# Patient Record
Sex: Female | Born: 1960 | Race: White | Hispanic: No | Marital: Single | Smoking: Former smoker
Health system: Southern US, Community
[De-identification: ages and names within clinical notes are randomized; demographics above are authoritative.]

## PROBLEM LIST (undated history)

## (undated) DIAGNOSIS — IMO0001 Reserved for inherently not codable concepts without codable children: Secondary | ICD-10-CM

## (undated) DIAGNOSIS — J302 Other seasonal allergic rhinitis: Secondary | ICD-10-CM

## (undated) DIAGNOSIS — K219 Gastro-esophageal reflux disease without esophagitis: Secondary | ICD-10-CM

## (undated) DIAGNOSIS — M51369 Other intervertebral disc degeneration, lumbar region without mention of lumbar back pain or lower extremity pain: Secondary | ICD-10-CM

## (undated) DIAGNOSIS — T7840XA Allergy, unspecified, initial encounter: Secondary | ICD-10-CM

## (undated) DIAGNOSIS — D219 Benign neoplasm of connective and other soft tissue, unspecified: Secondary | ICD-10-CM

## (undated) DIAGNOSIS — M519 Unspecified thoracic, thoracolumbar and lumbosacral intervertebral disc disorder: Secondary | ICD-10-CM

## (undated) DIAGNOSIS — H409 Unspecified glaucoma: Secondary | ICD-10-CM

## (undated) DIAGNOSIS — Z8742 Personal history of other diseases of the female genital tract: Secondary | ICD-10-CM

## (undated) DIAGNOSIS — M5136 Other intervertebral disc degeneration, lumbar region: Secondary | ICD-10-CM

## (undated) HISTORY — DX: Allergy, unspecified, initial encounter: T78.40XA

## (undated) HISTORY — DX: Other intervertebral disc degeneration, lumbar region: M51.36

## (undated) HISTORY — DX: Reserved for inherently not codable concepts without codable children: IMO0001

## (undated) HISTORY — DX: Gastro-esophageal reflux disease without esophagitis: K21.9

## (undated) HISTORY — DX: Benign neoplasm of connective and other soft tissue, unspecified: D21.9

## (undated) HISTORY — DX: Unspecified thoracic, thoracolumbar and lumbosacral intervertebral disc disorder: M51.9

## (undated) HISTORY — PX: BACK SURGERY: SHX140

## (undated) HISTORY — DX: Other seasonal allergic rhinitis: J30.2

## (undated) HISTORY — DX: Personal history of other diseases of the female genital tract: Z87.42

## (undated) HISTORY — DX: Unspecified glaucoma: H40.9

## (undated) HISTORY — DX: Other intervertebral disc degeneration, lumbar region without mention of lumbar back pain or lower extremity pain: M51.369

---

## 2004-08-20 ENCOUNTER — Ambulatory Visit: Payer: Self-pay

## 2005-06-20 HISTORY — PX: VESICOVAGINAL FISTULA CLOSURE W/ TAH: SUR271

## 2005-11-02 ENCOUNTER — Ambulatory Visit: Payer: Self-pay

## 2006-02-13 ENCOUNTER — Ambulatory Visit: Payer: Self-pay | Admitting: Obstetrics and Gynecology

## 2006-06-20 HISTORY — PX: ABDOMINAL HYSTERECTOMY: SHX81

## 2006-06-20 HISTORY — PX: LAPAROSCOPIC SUPRACERVICAL HYSTERECTOMY: SHX5399

## 2007-03-29 ENCOUNTER — Ambulatory Visit: Payer: Self-pay

## 2007-04-02 ENCOUNTER — Encounter: Payer: Self-pay | Admitting: Gastroenterology

## 2007-04-30 ENCOUNTER — Encounter: Payer: Self-pay | Admitting: Gastroenterology

## 2007-04-30 ENCOUNTER — Ambulatory Visit: Payer: Self-pay | Admitting: Gastroenterology

## 2008-04-17 ENCOUNTER — Ambulatory Visit: Payer: Self-pay

## 2009-04-20 ENCOUNTER — Ambulatory Visit: Payer: Self-pay

## 2010-05-10 ENCOUNTER — Ambulatory Visit: Payer: Self-pay | Admitting: Unknown Physician Specialty

## 2010-05-20 ENCOUNTER — Encounter (INDEPENDENT_AMBULATORY_CARE_PROVIDER_SITE_OTHER): Payer: Self-pay | Admitting: *Deleted

## 2010-06-08 ENCOUNTER — Ambulatory Visit: Payer: Self-pay | Admitting: Gastroenterology

## 2010-07-02 ENCOUNTER — Encounter (INDEPENDENT_AMBULATORY_CARE_PROVIDER_SITE_OTHER): Payer: Self-pay | Admitting: *Deleted

## 2010-07-12 ENCOUNTER — Encounter: Payer: Self-pay | Admitting: Gastroenterology

## 2010-07-12 ENCOUNTER — Ambulatory Visit
Admission: RE | Admit: 2010-07-12 | Discharge: 2010-07-12 | Payer: Self-pay | Source: Home / Self Care | Attending: Gastroenterology | Admitting: Gastroenterology

## 2010-07-20 NOTE — Letter (Signed)
Summary: Pre Visit Letter Revised  [facility]  [address] [place], [place] [address]   Phone: [phone]  Fax: [phone]        05/20/2010 MRN: [id number] [facility] [address] [place], [place]  [address]             Procedure Date:  06/25/2010   Welcome to the Gastroenterology Division at [facility].    You are scheduled to see a nurse for your pre-procedure visit on 06/08/2010 at 4:30 on the 3rd floor at [facility], [address]  We ask that you try to arrive at our office 15 minutes prior to your appointment time to allow for check-in.  Please take a minute to review the attached form.  If you answer "Yes" to one or more of the questions on the first page, we ask that you call the person listed at your earliest opportunity.  If you answer "No" to all of the questions, please complete the rest of the form and bring it to your appointment.    Your nurse visit will consist of discussing your medical and surgical history, your immediate family medical history, and your medications.   If you are unable to list all of your medications on the form, please bring the medication bottles to your appointment and we will list them.  We will need to be aware of both prescribed and over the counter drugs.  We will need to know exact dosage information as well.    Please be prepared to read and sign documents such as consent forms, a financial agreement, and acknowledgement forms.  If necessary, and with your consent, a friend or relative is welcome to sit-in on the nurse visit with you.  Please bring your insurance card so that we may make a copy of it.  If your insurance requires a referral to see a specialist, please bring your referral form from your primary care physician.  No co-pay is required for this nurse visit.     If you cannot keep your appointment, please call [phone] to cancel or reschedule prior to your appointment date.  This  allows [place] the opportunity to schedule an appointment for another patient in need of care.    Thank you for choosing [facility] for your medical needs.  We appreciate the opportunity to care for you.  Please visit [place] at our website  to learn more about our practice.  Sincerely, The Gastroenterology Division

## 2010-07-22 NOTE — Letter (Signed)
Summary: [facility] Instructions  [facility]  [address] [place], [place] [address]   Phone: [phone]  Fax: [phone]       Lindsay Lane    04-16-1961    MRN: [id number]        Procedure Day /Date: Monday 07-12-10     Arrival Time: 1:00 pm     Procedure Time: 2:00 pm     Location of Procedure:                    _x _  [facility] (4th Floor)   PREPARATION FOR COLONOSCOPY WITH MOVIPREP   Starting 5 days prior to your procedure  07-07-10  do not eat nuts, seeds, popcorn, corn, beans, peas,  salads, or any raw vegetables.  Do not take any fiber supplements (e.g. Metamucil, Citrucel, and Benefiber).  THE DAY BEFORE YOUR PROCEDURE         DATE:  07-11-10  DAY:  Sunday   1.  Drink clear liquids the entire day-NO SOLID FOOD  2.  Do not drink anything colored red or purple.  Avoid juices with pulp.  No orange juice.  3.  Drink at least 64 oz. (8 glasses) of fluid/clear liquids during the day to prevent dehydration and help the prep work efficiently.  CLEAR LIQUIDS INCLUDE: Water Jello Ice Popsicles Tea (sugar ok, no milk/cream) Powdered fruit flavored drinks Coffee (sugar ok, no milk/cream) Gatorade Juice: apple, white grape, white cranberry  Lemonade Clear bullion, consomm, broth Carbonated beverages (any kind) Strained chicken noodle soup Hard Candy                             4.  In the morning, mix first dose of MoviPrep solution:    Empty 1 Pouch A and 1 Pouch B into the disposable container    Add lukewarm drinking water to the top line of the container. Mix to dissolve    Refrigerate (mixed solution should be used within 24 hrs)  5.  Begin drinking the prep at 5:00 p.m. The MoviPrep container is divided by 4 marks.   Every 15 minutes drink the solution down to the next mark (approximately 8 oz) until the full liter is complete.   6.  Follow completed prep with 16 oz of clear liquid of your choice (Nothing red or purple).   Continue to drink clear liquids until bedtime.  7.  Before going to bed, mix second dose of MoviPrep solution:    Empty 1 Pouch A and 1 Pouch B into the disposable container    Add lukewarm drinking water to the top line of the container. Mix to dissolve    Refrigerate  THE DAY OF YOUR PROCEDURE      DATE:  07-12-10   DAY:  Monday  Beginning at  9:00 a.m. (5 hours before procedure):         1. Every 15 minutes, drink the solution down to the next mark (approx 8 oz) until the full liter is complete.  2. Follow completed prep with 16 oz. of clear liquid of your choice.    3. You may drink clear liquids until  12:00 p.m. (2 HOURS BEFORE PROCEDURE).   MEDICATION INSTRUCTIONS  Unless otherwise instructed, you should take regular prescription medications with a small sip of water   as early as possible the morning of your procedure.  OTHER INSTRUCTIONS  You will need a responsible adult at least 51 years of age to accompany you and drive you home.   This person must remain in the waiting room during your procedure.  Wear loose fitting clothing that is easily removed.  Leave jewelry and other valuables at home.  However, you may wish to bring a book to read or  an iPod/MP3 player to listen to music as you wait for your procedure to start.  Remove all body piercing jewelry and leave at home.  Total time from sign-in until discharge is approximately 2-3 hours.  You should go home directly after your procedure and rest.  You can resume normal activities the  day after your procedure.  The day of your procedure you should not:   Drive   Make legal decisions   Operate machinery   Drink alcohol   Return to work  You will receive specific instructions about eating, activities and medications before you leave.    The above instructions have been reviewed and explained to me by   Karl Bales RN  July 05, 2010 2:39 PM    I fully understand and can  verbalize these instructions _____________________________ Date _________

## 2010-07-22 NOTE — Miscellaneous (Signed)
Summary: [facility] previsit  Clinical Lists Changes  Medications: Added new medication of MOVIPREP 100 GM  SOLR (PEG-KCL-NACL-NASULF-NA ASC-C) As per prep instructions. - Signed Rx of MOVIPREP 100 GM  SOLR (PEG-KCL-NACL-NASULF-NA ASC-C) As per prep instructions.;  #1 x 0;  Signed;  Entered by: Karl Bales RN;  Authorized by: Louis Meckel MD;  Method used: Electronically to [id number] [address] [place], [place], [place]  [address], Ph: [phone], Fax: [phone] Observations: Added new observation of NKA: T (07/05/2010 14:19)    Prescriptions: MOVIPREP 100 GM  SOLR (PEG-KCL-NACL-NASULF-NA ASC-C) As per prep instructions.  #1 x 0   Entered by:   Karl Bales RN   Authorized by:   Louis Meckel MD   Signed by:   Karl Bales RN on 07/05/2010   Method used:   Electronically to        [facility] [phone]* (retail)       [address]       [place], [place]  [address]       Ph: [phone]       Fax: [phone]   RxID:   [id number]

## 2010-07-22 NOTE — Procedures (Signed)
Summary: Colonoscopy  Patient: [name] Note: All result statuses are Final unless otherwise noted.  Tests: (1) Colonoscopy (COL)   COL Colonoscopy           DONE     [facility]     [address]     [place], [place]  [address]           COLONOSCOPY PROCEDURE REPORT           PATIENT:  Lindsay Lane, Lindsay Lane  MR#:  #[id number]     BIRTHDATE:  Jul 25, 1960, 49 yrs. old  GENDER:  female           ENDOSCOPIST:  Barbette Hair. Arlyce Dice, MD     Referred by:  Adriana Reams, M.D.           PROCEDURE DATE:  07/12/2010     PROCEDURE:  Diagnostic Colonoscopy     ASA CLASS:  Class I     INDICATIONS:  1) screening  2) history of pre-cancerous     (adenomatous) colon polyps Last colo >5 years ago           MEDICATIONS:   Fentanyl 75 mcg IV, Versed 9 mg IV           DESCRIPTION OF PROCEDURE:   After the risks benefits and     alternatives of the procedure were thoroughly explained, informed     consent was obtained.  Digital rectal exam was performed and     revealed no abnormalities.   The LB CF-H180AL [id number] endoscope     was introduced through the anus and advanced to the cecum, which     was identified by both the appendix and ileocecal valve, without     limitations.  The quality of the prep was excellent, using     MoviPrep.  The instrument was then slowly withdrawn as the colon     was fully examined.     <<PROCEDUREIMAGES>>           FINDINGS:  Mild diverticulosis was found in the sigmoid colon (see     image10).  Internal hemorrhoids were found (see image13).  This     was otherwise a normal examination of the colon (see image2,     image3, image5, image6, image7, and image12).   Retroflexed views     in the rectum revealed no abnormalities.    The time to cecum =     2.75  minutes. The scope was then withdrawn (time =  6.0  min) from     the patient and the procedure completed.           COMPLICATIONS:  None           ENDOSCOPIC IMPRESSION:     1) Mild diverticulosis in the  sigmoid colon     2) Internal hemorrhoids     3) Otherwise normal examination     RECOMMENDATIONS:     1) Colonoscopy 7 years           REPEAT EXAM:   7 year(s) Colonoscopy           ______________________________     Barbette Hair. Arlyce Dice, MD           CC:           n.     eSIGNED:   Barbette Hair. Abyan Cadman at 07/12/2010 02:51 PM           Georganna Skeans, #[id number]  Note: An exclamation  mark (!) indicates a result that was not dispersed into the flowsheet. Document Creation Date: 07/12/2010 3:03 PM _______________________________________________________________________  (1) Order result status: Final Collection or observation date-time: 07/12/2010 14:45 Requested date-time:  Receipt date-time:  Reported date-time:  Referring Physician:   Ordering Physician: Melvia Heaps [phone]) Specimen Source:  Source: Launa Grill Order Number: [id number] Lab site:   Appended Document: Colonoscopy    Clinical Lists Changes  Observations: Added new observation of COLONNXTDUE: 06/2017 (07/12/2010 16:43)

## 2010-08-17 NOTE — Letter (Signed)
Summary: Jule Economy MD  Jule Economy MD   Imported By: Lester [place] 08/13/2010 07:37:53  _____________________________________________________________________  External Attachment:    Type:   Image     Comment:   External Document

## 2010-08-17 NOTE — Letter (Signed)
Summary: Sharyne Peach MD  Sharyne Peach MD   Imported By: Lester [place] 08/13/2010 07:46:59  _____________________________________________________________________  External Attachment:    Type:   Image     Comment:   External Document

## 2010-08-17 NOTE — Procedures (Signed)
Summary: Everardo All MD  Everardo All MD   Imported By: Lester [place] 08/13/2010 07:43:02  _____________________________________________________________________  External Attachment:    Type:   Image     Comment:   External Document

## 2011-07-12 ENCOUNTER — Ambulatory Visit: Payer: Self-pay | Admitting: Unknown Physician Specialty

## 2011-08-03 ENCOUNTER — Ambulatory Visit: Payer: Self-pay | Admitting: Family Medicine

## 2011-08-12 ENCOUNTER — Ambulatory Visit: Payer: Self-pay

## 2011-11-15 ENCOUNTER — Ambulatory Visit: Payer: Self-pay | Admitting: Otolaryngology

## 2011-11-19 HISTORY — PX: NASAL SINUS SURGERY: SHX719

## 2012-06-08 ENCOUNTER — Other Ambulatory Visit (INDEPENDENT_AMBULATORY_CARE_PROVIDER_SITE_OTHER): Payer: BC Managed Care – PPO

## 2012-06-08 ENCOUNTER — Encounter: Payer: Self-pay | Admitting: Internal Medicine

## 2012-06-08 ENCOUNTER — Ambulatory Visit (INDEPENDENT_AMBULATORY_CARE_PROVIDER_SITE_OTHER): Payer: BC Managed Care – PPO | Admitting: Internal Medicine

## 2012-06-08 VITALS — BP 130/88 | HR 90 | Temp 97.4°F | Ht 63.0 in | Wt 165.8 lb

## 2012-06-08 DIAGNOSIS — R05 Cough: Secondary | ICD-10-CM

## 2012-06-08 DIAGNOSIS — J329 Chronic sinusitis, unspecified: Secondary | ICD-10-CM

## 2012-06-08 DIAGNOSIS — R059 Cough, unspecified: Secondary | ICD-10-CM

## 2012-06-08 LAB — CBC WITH DIFFERENTIAL/PLATELET
Basophils Absolute: 0.1 10*3/uL (ref 0.0–0.1)
Basophils Relative: 0.6 % (ref 0.0–3.0)
Eosinophils Relative: 15.8 % — ABNORMAL HIGH (ref 0.0–5.0)
HCT: 44 % (ref 36.0–46.0)
Hemoglobin: 15.3 g/dL — ABNORMAL HIGH (ref 12.0–15.0)
Lymphs Abs: 2.1 10*3/uL (ref 0.7–4.0)
Monocytes Relative: 4.7 % (ref 3.0–12.0)
Neutro Abs: 6 10*3/uL (ref 1.4–7.7)
RBC: 4.79 Mil/uL (ref 3.87–5.11)
RDW: 12.7 % (ref 11.5–14.6)

## 2012-06-08 MED ORDER — MOMETASONE FURO-FORMOTEROL FUM 100-5 MCG/ACT IN AERO: INHALATION_SPRAY | RESPIRATORY_TRACT | Status: DC

## 2012-06-08 MED ORDER — PREDNISONE (PAK) 10 MG PO TABS: ORAL_TABLET | ORAL | Status: DC

## 2012-06-08 MED ORDER — AMOXICILLIN-POT CLAVULANATE 875-125 MG PO TABS: 1.0000 | ORAL_TABLET | Freq: Two times a day (BID) | ORAL | Status: DC

## 2012-06-08 MED ORDER — TRAMADOL HCL 50 MG PO TABS: ORAL_TABLET | ORAL | Status: DC

## 2012-06-08 NOTE — Progress Notes (Signed)
  Subjective:    Patient ID: Lindsay Lane, female    DOB: [birth year]  MRN: [id number]  HPI  16 yowf quit smoking Oc 2012  After coughing x sev months but did not improve consistently and so self referred to pulmonary clinic 06/08/2012    06/08/2012 1st pulmonary eval cc persistent cough x summer of 2012 never 100% better x maybe for a few weeks after sinus surgery late May 2013 [place] then much worse since last round of prednisone dx as pna as UC 06/01/12 and rx with levaquin but no better to date with cough to point of gagging but not vomiting.  Cough is variably productive of greenish mucus. No obvious daytime variabilty or assoc cp or chest tightness, subjective wheeze overt sinus or hb symptoms. No unusual exp hx or h/o childhood pna/ asthma or premature birth to her knowledge.    Albuterol helps some.  Sleeping ok without nocturnal  or early am exacerbation  of respiratory  c/o's or need for noct saba. Also denies any obvious fluctuation of symptoms with weather or environmental changes or other aggravating or alleviating factors except as outlined above   Review of Systems  Constitutional: Negative for fever, chills and unexpected weight change.  HENT: Positive for sore throat. Negative for ear pain, nosebleeds, congestion, rhinorrhea, sneezing, trouble swallowing, dental problem, voice change, postnasal drip and sinus pressure.   Eyes: Negative for visual disturbance.  Respiratory: Positive for cough and shortness of breath. Negative for choking.   Cardiovascular: Negative for chest pain and leg swelling.  Gastrointestinal: Negative for vomiting, abdominal pain and diarrhea.  Genitourinary: Negative for difficulty urinating.  Musculoskeletal: Positive for arthralgias.  Skin: Negative for rash.  Neurological: Negative for tremors, syncope and headaches.  Hematological: Does not bruise/bleed easily.       Objective:   Physical Exam  amb wf nad Wt Readings from Last 3  Encounters:  06/08/12 165 lb 12.8 oz (75.206 kg)    HEENT: nl dentition,  and orophanx. Nl external ear canals without cough reflex. L turbinates with copious mucopurulent discharge and generalized swelling. No polyps seen    NECK :  without JVD/Nodes/TM/ nl carotid upstrokes bilaterally   LUNGS: no acc muscle use, clear to A and P bilaterally without cough on insp or exp maneuvers   CV:  RRR  no s3 or murmur or increase in P2, no edema   ABD:  soft and nontender with nl excursion in the supine position. No bruits or organomegaly, bowel sounds nl  MS:  warm without deformities, calf tenderness, cyanosis or clubbing  SKIN: warm and dry without lesions    NEURO:  alert, approp, no deficits        Assessment & Plan:

## 2012-06-08 NOTE — Patient Instructions (Addendum)
dulera 100 Take 2 puffs first thing in am and then another 2 puffs about 12 hours later.   Take mucinex dm 2 every  12 hours and supplement if needed with  tramadol 50 mg up to 2 every 4 hours to suppress the urge to cough. Swallowing water or using ice chips/non mint and menthol containing candies (such as lifesavers or sugarless jolly ranchers) are also effective.  You should rest your voice and avoid activities that you know make you cough.  Once you have eliminated the cough for 3 straight days try reducing the tramadol first,  then the delsym as tolerated.    Augmentin 875 twice daily x 21 days  Prednisone 10 mg take  4 each am x 2 days,   2 each am x 2 days,  1 each am x2days and stop   Prilosec 20 mg Take 30-60 min before first meal of the day and Pepcid ac 20 mg one at bedtime  GERD (REFLUX)  is an extremely common cause of respiratory symptoms, many times with no significant heartburn at all.    It can be treated with medication, but also with lifestyle changes including avoidance of late meals, excessive alcohol, smoking cessation, and avoid fatty foods, chocolate, peppermint, colas, red wine, and acidic juices such as orange juice.  NO MINT OR MENTHOL PRODUCTS SO NO COUGH DROPS  USE SUGARLESS CANDY INSTEAD (jolley ranchers or Stover's)  NO OIL BASED VITAMINS - use powdered substitutes.  Please remember to go to the lab   department downstairs for your tests - we will call you with the results when they are available.     Please schedule a follow up office visit in 3 weeks, sooner if needed

## 2012-06-09 DIAGNOSIS — J329 Chronic sinusitis, unspecified: Secondary | ICD-10-CM | POA: Insufficient documentation

## 2012-06-09 NOTE — Assessment & Plan Note (Signed)
The most common causes of chronic cough in immunocompetent adults include the following: upper airway cough syndrome (UACS), previously referred to as postnasal drip syndrome (PNDS), which is caused by variety of rhinosinus conditions; (2) asthma; (3) GERD; (4) chronic bronchitis from cigarette smoking or other inhaled environmental irritants; (5) nonasthmatic eosinophilic bronchitis; and (6) bronchiectasis.   These conditions, singly or in combination, have accounted for up to 94% of the causes of chronic cough in prospective studies.   Other conditions have constituted no >6% of the causes in prospective studies These have included bronchogenic carcinoma, chronic interstitial pneumonia, sarcoidosis, left ventricular failure, ACEI-induced cough, and aspiration from a condition associated with pharyngeal dysfunction.    Chronic cough is often simultaneously caused by more than one condition. A single cause has been found from 38 to 82% of the time, multiple causes from 18 to 62%. Multiply caused cough has been the result of three diseases up to 42% of the time.       Most likely this is  Classic Upper airway cough syndrome, so named because it's frequently impossible to sort out how much is  CR/sinusitis with freq throat clearing (which can be related to primary GERD)   vs  causing  secondary (" extra esophageal")  GERD from wide swings in gastric pressure that occur with throat clearing, often  promoting self use of mint and menthol lozenges that reduce the lower esophageal sphincter tone and exacerbate the problem further in a cyclical fashion.   These are the same pts (now being labeled as having "irritable larynx syndrome" by some cough centers) who not infrequently have a history of having failed to tolerate ace inhibitors,  dry powder inhalers or biphosphonates or report having atypical reflux symptoms that don't respond to standard doses of PPI , and are easily confused as having aecopd or asthma  flares by even experienced allergists/ pulmonologists, and given her poorly controlled rhinitis she may have secondary asthma at this point  Therefore rec max rx directed at sinusitis and trial of dulera 100 2bid for the ? Asthmatic component.   See instructions for specific recommendations which were reviewed directly with the patient who was given a copy with highlighter outlining the key components.

## 2012-06-09 NOTE — Assessment & Plan Note (Signed)
-   Allergy profile 06/08/2012 > Eos 15.8% >> - augmentin x 21 days 06/09/2012 >>>  This much eosinophilia is typical of both allergic and fungal sinusitis and will need f/u either by ent or allergy, depending on IgE profile   In meantime rx empirically with 3 weeks of augmentin and continue topical rx

## 2012-06-11 ENCOUNTER — Encounter: Payer: Self-pay | Admitting: Internal Medicine

## 2012-06-11 LAB — ALLERGY PROFILE REGION II-DC, DE, MD, ~~LOC~~, VA
Alternaria Alternata: <0.1 kU/L
Aspergillus fumigatus, IgG: <0.1 kU/L
Cat Dander: <0.1 kU/L
Cladosporium Herbarum: <0.1 kU/L
D. farinae: <0.1 kU/L
Elm IgE: <0.1 kU/L
IgE (Immunoglobulin E), Serum: 330.4 IU/mL — ABNORMAL HIGH (ref 0.0–180.0)
Johnson Grass: 0.18 kU/L — ABNORMAL HIGH

## 2012-06-12 NOTE — Progress Notes (Signed)
Quick Note:  Spoke with pt and notified of results per Dr. Wert. Pt verbalized understanding and denied any questions.  ______ 

## 2012-07-02 ENCOUNTER — Encounter: Payer: Self-pay | Admitting: Internal Medicine

## 2012-07-02 ENCOUNTER — Ambulatory Visit (INDEPENDENT_AMBULATORY_CARE_PROVIDER_SITE_OTHER): Payer: BC Managed Care – PPO | Admitting: Internal Medicine

## 2012-07-02 VITALS — BP 126/70 | HR 76 | Temp 97.6°F | Ht 63.5 in | Wt 164.0 lb

## 2012-07-02 DIAGNOSIS — R05 Cough: Secondary | ICD-10-CM

## 2012-07-02 DIAGNOSIS — J329 Chronic sinusitis, unspecified: Secondary | ICD-10-CM

## 2012-07-02 MED ORDER — FAMOTIDINE 20 MG PO TABS: ORAL_TABLET | ORAL | Status: DC

## 2012-07-02 NOTE — Patient Instructions (Addendum)
If cough or short of breath > dulera 100 2puffs every 12 hours  For congestion/ thick mucus > mucinex dm and supplement with tramadol if really bad   I emphasized that nasal steroids have no immediate benefit in terms of improving symptoms.  To help them reached the target tissue, the patient should use Afrin two puffs every 12 hours applied one min before using the nasal steroids.  Afrin should be stopped after no more than 5 days.  If the symptoms worsen, Afrin can be restarted after 5 days off of therapy to prevent rebound congestion from overuse of Afrin.  I also emphasized that in no way are nasal steroids a concern in terms of "addiction".   Please schedule a follow up office visit in 6 weeks, call sooner if needed  Late add:  Consider singulair/ allergy eval next ov

## 2012-07-02 NOTE — Progress Notes (Signed)
Subjective:    Patient ID: Lindsay Lane, female    DOB: 02/04/1961  MRN: [id number]  HPI  11 yowf quit smoking Oct 2012  After coughing x sev months but did not improve consistently and so self referred to pulmonary clinic 06/08/2012    06/08/2012 1st pulmonary eval cc persistent cough x summer of 2012 never 100% better x maybe for a few weeks after sinus surgery late May 2013 [place] then much worse since last round of prednisone dx as pna as UC 06/01/12 and rx with levaquin but no better to date with cough to point of gagging but not vomiting.  Cough is variably productive of greenish mucus. rec dulera 100 Take 2 puffs first thing in am and then another 2 puffs about 12 hours later.  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    07/02/2012 f/u ov/Lindsay Lane cc 100% better while on augmentin, and acid suppression, and able stop the cough meds but then sev days prior to OV  Felt nasal congestion coming back -  in retrospect all these problems started w/in one year of new dog in house. Stopped dulera as no sob     No obvious daytime variabilty or assoc cp or chest tightness, subjective wheeze overt sinus or hb symptoms. No unusual exp hx or h/o childhood pna/ asthma or premature birth to her knowledge.     Sleeping ok without nocturnal  or early am exacerbation  of respiratory  c/o's or need for noct saba. Also denies any obvious fluctuation of symptoms with weather or environmental changes or other aggravating or alleviating factors except as outlined above.    ROS  The following are not active complaints unless bolded sore throat, dysphagia,  dental problems, itching, sneezing,  nasal congestion or excess/ purulent secretions, ear ache,   fever, chills, sweats, unintended wt loss, pleuritic or exertional cp, hemoptysis,  orthopnea pnd or leg swelling, presyncope, palpitations, heartburn, abdominal pain, anorexia, nausea, vomiting, diarrhea  or change in bowel or urinary habits, change in stools or urine, dysuria,hematuria,  rash, arthralgias, visual complaints, headache, numbness weakness or ataxia or problems with walking or coordination,  change in mood/affect or memory.            Objective:   Physical Exam  amb wf nad Wt 164 07/02/2012  Wt Readings from Last 3 Encounters:  06/08/12 165 lb 12.8 oz (75.206 kg)    HEENT: nl dentition,  and orophanx. Nl external ear canals without cough reflex. L turbinates with copious mucopurulent discharge and generalized swelling. No polyps seen    NECK :  without JVD/Nodes/TM/ nl carotid upstrokes bilaterally   LUNGS: no acc muscle use, clear to A and P bilaterally without cough on insp or exp maneuvers   CV:  RRR  no s3 or murmur or increase in P2, no edema   ABD:  soft and nontender with nl excursion in the supine position. No bruits or organomegaly, bowel sounds nl  MS:  warm without deformities, calf tenderness, cyanosis or  clubbing  SKIN: warm and dry without lesions    NEURO:  alert, approp, no deficits        Assessment & Plan:

## 2012-07-02 NOTE — Assessment & Plan Note (Signed)
-   Allergy profile 06/08/2012 > Eos 15.8% > IgE 330.4 > Pos dogs, grass, ragweed - augmentin x 21 days 06/09/2012 > improved at ov 07/02/2012   I reviewed both graphic and text formatted material regarding the diagnosis and management of rhinitis including how to use topical steroids effectively and why it is necessary to treat nasal obstruction and inflammation and infection all at  the same time to eradicate the cycle of inflammation causing obstruction causing an infection causing inflammation and so forth....  May benefit from formal allergy eval if nasal steroids not effective (also consider singulair )

## 2012-07-02 NOTE — Assessment & Plan Note (Signed)
Most likely this is allergic rhinitis/ sinusitis > pnds transiently better from augmentin and prednisone, could also have component of cough variant asthma or eosinophilic bronchitis based on such convincing response to prednisone.  For now will max rx for sinusitis/ rhinitis and see if recurs and in meantime continue max acid suppression to prevent cyclical coughing.

## 2012-07-30 ENCOUNTER — Ambulatory Visit: Payer: BC Managed Care – PPO | Admitting: Internal Medicine

## 2012-08-01 ENCOUNTER — Ambulatory Visit: Payer: BC Managed Care – PPO | Admitting: Internal Medicine

## 2012-12-17 ENCOUNTER — Encounter: Payer: Self-pay | Admitting: Internal Medicine

## 2012-12-17 ENCOUNTER — Ambulatory Visit (INDEPENDENT_AMBULATORY_CARE_PROVIDER_SITE_OTHER): Payer: BC Managed Care – PPO | Admitting: Internal Medicine

## 2012-12-17 VITALS — BP 132/82 | HR 76 | Temp 97.9°F | Ht 63.0 in | Wt 156.0 lb

## 2012-12-17 DIAGNOSIS — R05 Cough: Secondary | ICD-10-CM

## 2012-12-17 DIAGNOSIS — J329 Chronic sinusitis, unspecified: Secondary | ICD-10-CM

## 2012-12-17 MED ORDER — METHYLPREDNISOLONE ACETATE 80 MG/ML IJ SUSP
120.0000 mg | Freq: Once | INTRAMUSCULAR | Status: AC
  Administered 2012-12-17: 120 mg via INTRAMUSCULAR

## 2012-12-17 MED ORDER — MONTELUKAST SODIUM 10 MG PO TABS: 10.0000 mg | ORAL_TABLET | Freq: Every day | ORAL | Status: DC

## 2012-12-17 MED ORDER — TRAMADOL HCL 50 MG PO TABS: ORAL_TABLET | ORAL | Status: DC

## 2012-12-17 MED ORDER — AMOXICILLIN-POT CLAVULANATE 875-125 MG PO TABS: 1.0000 | ORAL_TABLET | Freq: Two times a day (BID) | ORAL | Status: DC

## 2012-12-17 NOTE — Progress Notes (Signed)
Subjective:    Patient ID: Lindsay Lane, female    DOB: 1960-07-10  MRN: [id number]  HPI  76 yowf quit smoking Oct 2012  After coughing x sev months but did not improve consistently and so self referred to pulmonary clinic 06/08/2012    06/08/2012 1st pulmonary eval cc persistent cough x summer of 2012 never 100% better x maybe for a few weeks after sinus surgery late May 2013 [place] then much worse since last round of prednisone dx as pna as UC 06/01/12 and rx with levaquin but no better to date with cough to point of gagging but not vomiting.  Cough is variably productive of greenish mucus. rec dulera 100 Take 2 puffs first thing in am and then another 2 puffs about 12 hours later.  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    07/02/2012 f/u ov/Lindsay Lane cc 100% better while on augmentin, and acid suppression, and able stop the cough meds but then sev days prior to OV  Felt nasal congestion coming back -  in retrospect all these problems started w/in one year of new dog in house. Stopped dulera as no sob   rec If cough or short of breath > dulera 100 2puffs every 12 hours For congestion/ thick mucus > mucinex dm and supplement with tramadol if really bad  I emphasized that nasal steroids have no immediate benefit in terms of improving symptoms.  To help them reached the target tissue, the patient should use Afrin two puffs every 12 hours applied one min before using the nasal steroids.  Afrin should be stopped after no more than 5 days.  If the symptoms worsen, Afrin can be restarted after 5 days off of therapy to prevent rebound congestion  from overuse of Afrin.  I also emphasized that in no way are nasal steroids a concern in terms of "addiction".  F/u 6 weeks > did not return as instructed     12/17/2012 f/u ov/Lindsay Lane f/u cough  Chief Complaint  Patient presents with  . Acute Visit    increased cough x 4 wks with green to greenish yellow mucus, wheezing, and sweats.     Better for months on nasonex and zyrtec then worse x 65mo but did not use afrin/ nasonex as instructed nor take dulera as per instructions but back on it at ov s improvement.     No obvious daytime variabilty or assoc cp or chest tightness, subjective wheeze overt sinus or hb symptoms. No unusual exp hx or h/o childhood pna/ asthma or premature birth to her knowledge.     Sleeping ok without nocturnal  or early am exacerbation  of respiratory  c/o's or need for noct saba. Also denies any obvious fluctuation of symptoms with weather or environmental changes or other aggravating or alleviating factors except as outlined above.    Current Medications, Allergies, Past Medical History, Past Surgical History, Family History, and Social History were reviewed in [employer] record.  ROS  The following are not  active complaints unless bolded sore throat, dysphagia, dental problems, itching, sneezing,  nasal congestion or excess/ purulent secretions, ear ache,   fever, chills, sweats, unintended wt loss, pleuritic or exertional cp, hemoptysis,  orthopnea pnd or leg swelling, presyncope, palpitations, heartburn, abdominal pain, anorexia, nausea, vomiting, diarrhea  or change in bowel or urinary habits, change in stools or urine, dysuria,hematuria,  rash, arthralgias, visual complaints, headache, numbness weakness or ataxia or problems with walking or coordination,  change in mood/affect or memory.                Objective:   Physical Exam  amb wf nad with nl vital signs  Wt 164 07/02/2012 > 12/17/2012  156  Wt Readings from Last 3  Encounters:  06/08/12 165 lb 12.8 oz (75.206 kg)    HEENT: nl dentition,  and orophanx. Nl external ear canals without cough reflex. L turbinates with copious mucopurulent discharge and generalized swelling. No polyps seen    NECK :  without JVD/Nodes/TM/ nl carotid upstrokes bilaterally   LUNGS: no acc muscle use, clear to A and P bilaterally without cough on insp or exp maneuvers   CV:  RRR  no s3 or murmur or increase in P2, no edema   ABD:  soft and nontender with nl excursion in the supine position. No bruits or organomegaly, bowel sounds nl  MS:  warm without deformities, calf tenderness, cyanosis or clubbing  SKIN: warm and dry without lesions             Assessment & Plan:

## 2012-12-17 NOTE — Patient Instructions (Addendum)
Try singulair 10 mg every evening  dulera 100 Take 2 puffs first thing in am and then another 2 puffs about 12 hours later.   Prednisone 10 mg take  4 each am x 2 days,   2 each am x 2 days,  1 each am x 2 days and stop   augmentin 875 mg twice daily x 10 days - extend to another 10 days if the mucus stays   I emphasized that nasal steroids have no immediate benefit in terms of improving symptoms.  To help them reached the target tissue, the patient should use Afrin two puffs every 12 hours applied one min before using the nasal steroids.  Afrin should be stopped after no more than 5 days.  If the symptoms come back, Afrin can be restarted after 5 days off of therapy to prevent rebound congestion from overuse of Afrin.  I also emphasized that in no way are nasal steroids a concern in terms of "addiction".   For congestion/ thick mucus > mucinex dm and supplement with tramadol if really bad   Also stay on the pepcid at bedtime as long as coughing

## 2012-12-19 NOTE — Assessment & Plan Note (Addendum)
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

## 2012-12-19 NOTE — Assessment & Plan Note (Signed)
Apparently flaring again > rx x up to 20 days then consider CT and either ent or allergy eval

## 2013-01-21 ENCOUNTER — Ambulatory Visit: Payer: BC Managed Care – PPO | Admitting: Internal Medicine

## 2013-01-25 ENCOUNTER — Encounter: Payer: Self-pay | Admitting: Internal Medicine

## 2013-01-25 ENCOUNTER — Ambulatory Visit (INDEPENDENT_AMBULATORY_CARE_PROVIDER_SITE_OTHER): Payer: BC Managed Care – PPO | Admitting: Internal Medicine

## 2013-01-25 VITALS — BP 120/82 | HR 63 | Temp 97.0°F | Ht 63.0 in | Wt 154.4 lb

## 2013-01-25 DIAGNOSIS — J329 Chronic sinusitis, unspecified: Secondary | ICD-10-CM

## 2013-01-25 NOTE — Patient Instructions (Addendum)
No change in maintenance medication   Add dulera back if breathing bothers you  Add mucinex dm as needed for cough  At any any respiratory  flare add afrin x 5 days    If you are satisfied with your treatment plan let your doctor know and he/she can either refill your medications or you can return here when your prescription runs out.     If in any way you are not 100% satisfied,  please tell us.  If 100% better, tell your friends!

## 2013-01-25 NOTE — Assessment & Plan Note (Addendum)
-   Allergy profile 06/08/2012 > Eos 15.8% > IgE 330.4 > Pos dogs, grass, ragweed - augmentin x 21 days 06/09/2012 > improved at ov 07/02/2012  - started singulair trial 12/17/12 > improved 01/25/2013   She clearly has an allergic component to her rhinitis/sinusitis and this triggers pnds /cough and may destabilize the lower airways as well  I had an extended summary  discussion with the patient today lasting 15 to 20 minutes of a 25 minute visit on the following issues:     Each maintenance medication was reviewed in detail including most importantly the difference between maintenance and as needed and under what circumstances the prns are to be used.  Please see instructions for details which were reviewed in writing and the patient given a copy.  If flares during ragweed season she should acutely increase her nasonex with afrin x 5 days but I would also strongly consider allergy eval and consideration for immunotherapy

## 2013-01-25 NOTE — Progress Notes (Signed)
Subjective:    Patient ID: Lindsay Lane, female    DOB: [birth year]  MRN: [id number]   Brief patient profile:  51 yowf quit smoking Oct 2012  After coughing x sev months but did not improve consistently and so self referred to pulmonary clinic 06/08/2012    06/08/2012 1st pulmonary eval cc persistent cough x summer of 2012 never 100% better x maybe for a few weeks after sinus surgery late May 2013 [place] then much worse since last round of prednisone dx as pna as UC 06/01/12 and rx with levaquin but no better to date with cough to point of gagging but not vomiting.  Cough is variably productive of greenish mucus. rec dulera 100 Take 2 puffs first thing in am and then another 2 puffs about 12 hours later.  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    07/02/2012 f/u ov/Lindsay Lane cc 100% better while on augmentin, and acid suppression, and able stop the cough meds but then sev days prior to OV  Felt nasal congestion coming back -  in retrospect all these problems started w/in one year of new dog in house. Stopped dulera as no sob   rec If cough or short of breath > dulera 100 2puffs every 12 hours For congestion/ thick mucus > mucinex dm and supplement with tramadol if really bad  I emphasized that nasal steroids have no immediate benefit  F/u 6 weeks > did not return as instructed     12/17/2012 f/u ov/Lindsay Lane f/u cough  Chief Complaint  Patient presents with  . Acute Visit    increased cough x 4 wks with green to greenish yellow mucus, wheezing, and sweats.   Better for months on nasonex and zyrtec then worse x 20mo but did not use afrin/  nasonex as instructed nor take dulera as per instructions but back on it at ov s improvement.  rec Try singulair 10 mg every evening dulera 100 Take 2 puffs first thing in am and then another 2 puffs about 12 hours later.  Prednisone 10 mg take  4 each am x 2 days,   2 each am x 2 days,  1 each am x 2 days and stop  augmentin 875 mg twice daily x 10 days - extend to another 10 days if the mucus stays  I emphasized that nasal steroids have no immediate benefit  .  For congestion/ thick mucus > mucinex dm and supplement with tramadol if really bad  Also stay on the pepcid at bedtime as long as coughing.   01/25/2013 f/u ov/Lindsay Lane  Still nasonex/singulair and prilosec ac bedtime pepcid not on dulera mucinex dm/tramadol Chief Complaint  Patient presents with  . Follow-up    Cough has resolved since her last visit. She c/o PND for the past several days. Using dulera PRN.    no sob at all not needing albuterol      No obvious daytime variabilty or assoc cp or chest tightness, subjective wheeze overt sinus or hb symptoms. No  unusual exp hx or h/o childhood pna/ asthma or premature birth to her knowledge.     Sleeping ok without nocturnal  or early am exacerbation  of respiratory  c/o's or need for noct saba. Also denies any obvious fluctuation of symptoms with weather or environmental changes or other aggravating or alleviating factors except as outlined above.    Current Medications, Allergies, Past Medical History, Past Surgical History, Family History, and Social History were reviewed in [employer] record.  ROS  The following are not active complaints unless bolded sore throat, dysphagia, dental problems, itching, sneezing,  nasal congestion or excess/ purulent secretions, ear ache,   fever, chills, sweats, unintended wt loss, pleuritic or exertional cp, hemoptysis,  orthopnea pnd or leg swelling, presyncope, palpitations, heartburn, abdominal pain, anorexia, nausea,  vomiting, diarrhea  or change in bowel or urinary habits, change in stools or urine, dysuria,hematuria,  rash, arthralgias, visual complaints, headache, numbness weakness or ataxia or problems with walking or coordination,  change in mood/affect or memory.                Objective:   Physical Exam  amb wf nad with nl vital signs  Wt Readings from Last 3 Encounters:  01/25/13 154 lb 6.4 oz (70.035 kg)  12/17/12 156 lb (70.761 kg)  07/02/12 164 lb (74.39 kg)      HEENT: nl dentition,  and orophanx. Nl external ear canals without cough reflex. L turbinates with copious mucopurulent discharge and generalized swelling. No polyps seen    NECK :  without JVD/Nodes/TM/ nl carotid upstrokes bilaterally   LUNGS: no acc muscle use, clear to A and P bilaterally without cough on insp or exp maneuvers   CV:  RRR  no s3 or murmur or increase in P2, no edema   ABD:  soft and nontender with nl excursion in the supine position. No bruits or organomegaly, bowel sounds nl  MS:  warm without deformities, calf tenderness, cyanosis or clubbing  SKIN: warm and dry without lesions             Assessment & Plan:

## 2013-04-25 ENCOUNTER — Other Ambulatory Visit: Payer: Self-pay

## 2013-04-28 IMAGING — CR DG CHEST 2V
1 series · 2 of 2 positions shown · non-contrast
Comparison: none

REASON FOR EXAM: chronic cough
COMMENTS:

PROCEDURE:     NAIHOMI - NAIHOMI CHEST PA (OR AP) AND LAT  - August 03, 2011  [DATE]
RESULT:     The lungs are clear. The heart and pulmonary vessels are normal.
The bony and mediastinal structures are unremarkable. There is no effusion.
There is no pneumothorax or evidence of congestive failure.

[Series 1: pa · 0.17mm/px · 2 of 2 slices shown]
[im 1/2]
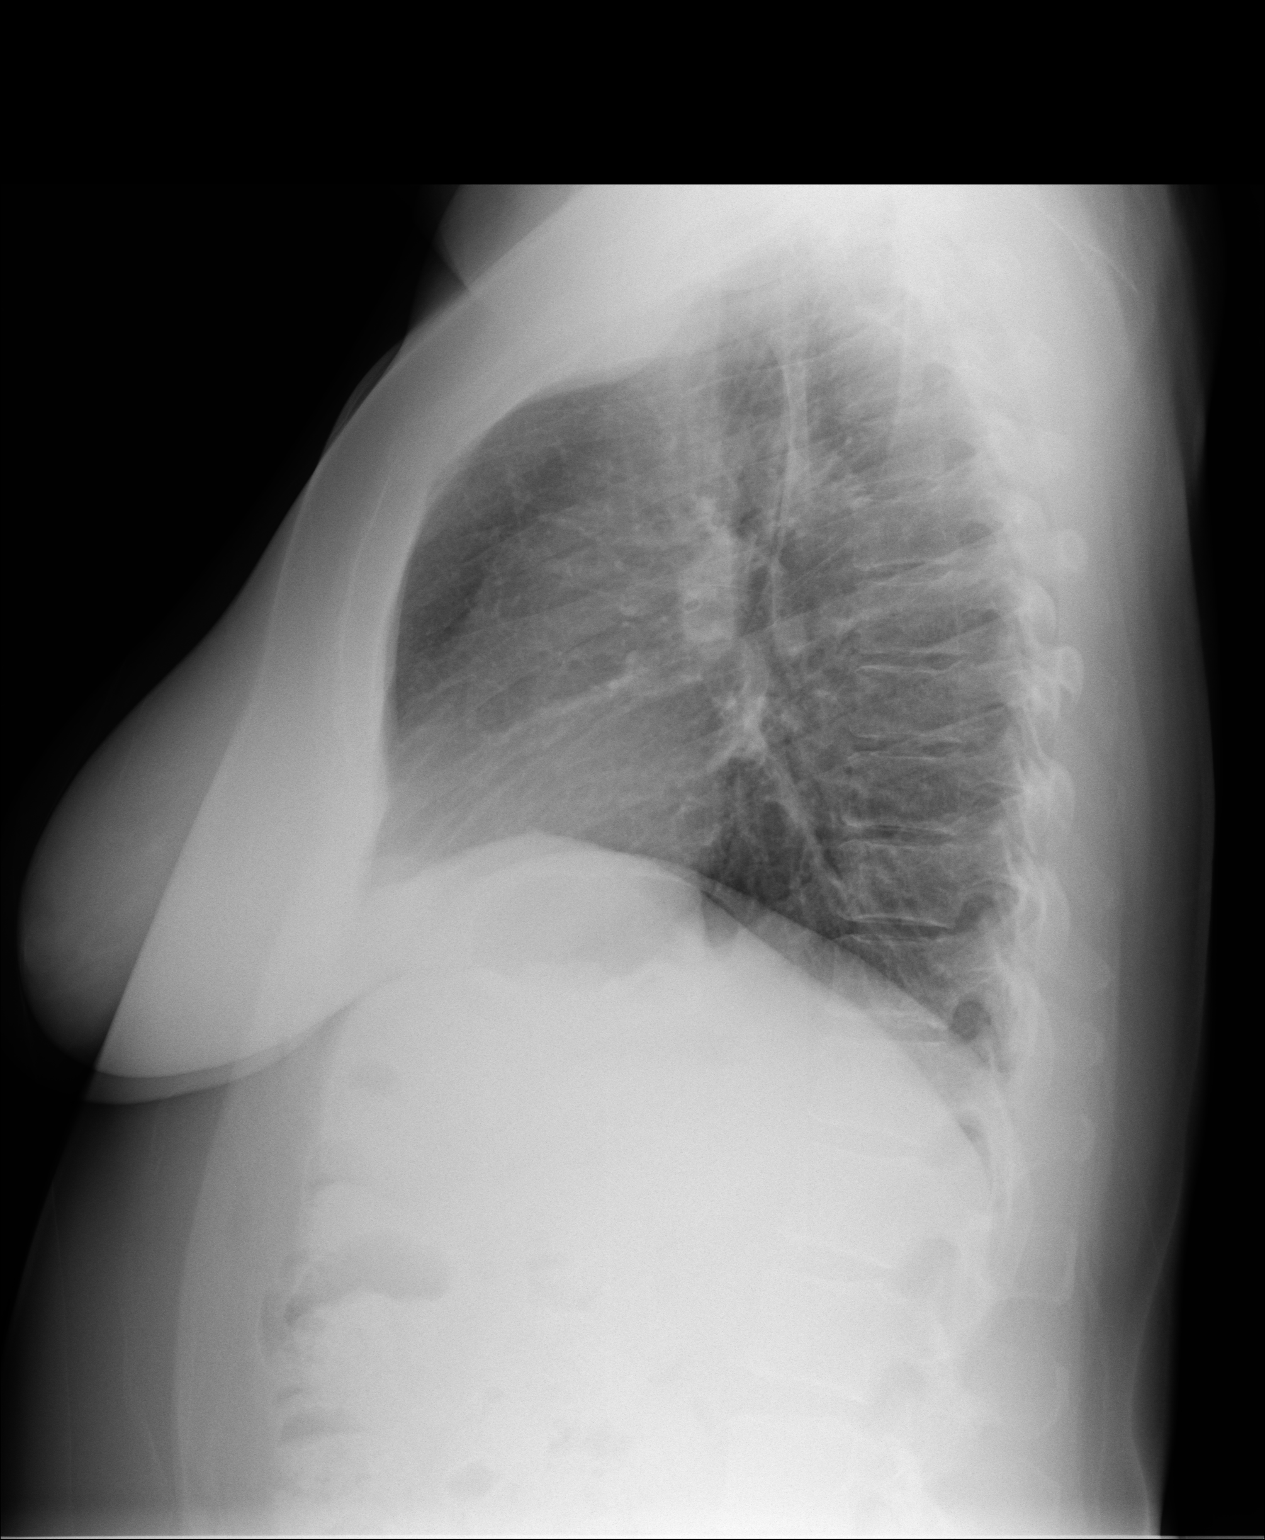
[im 2/2]
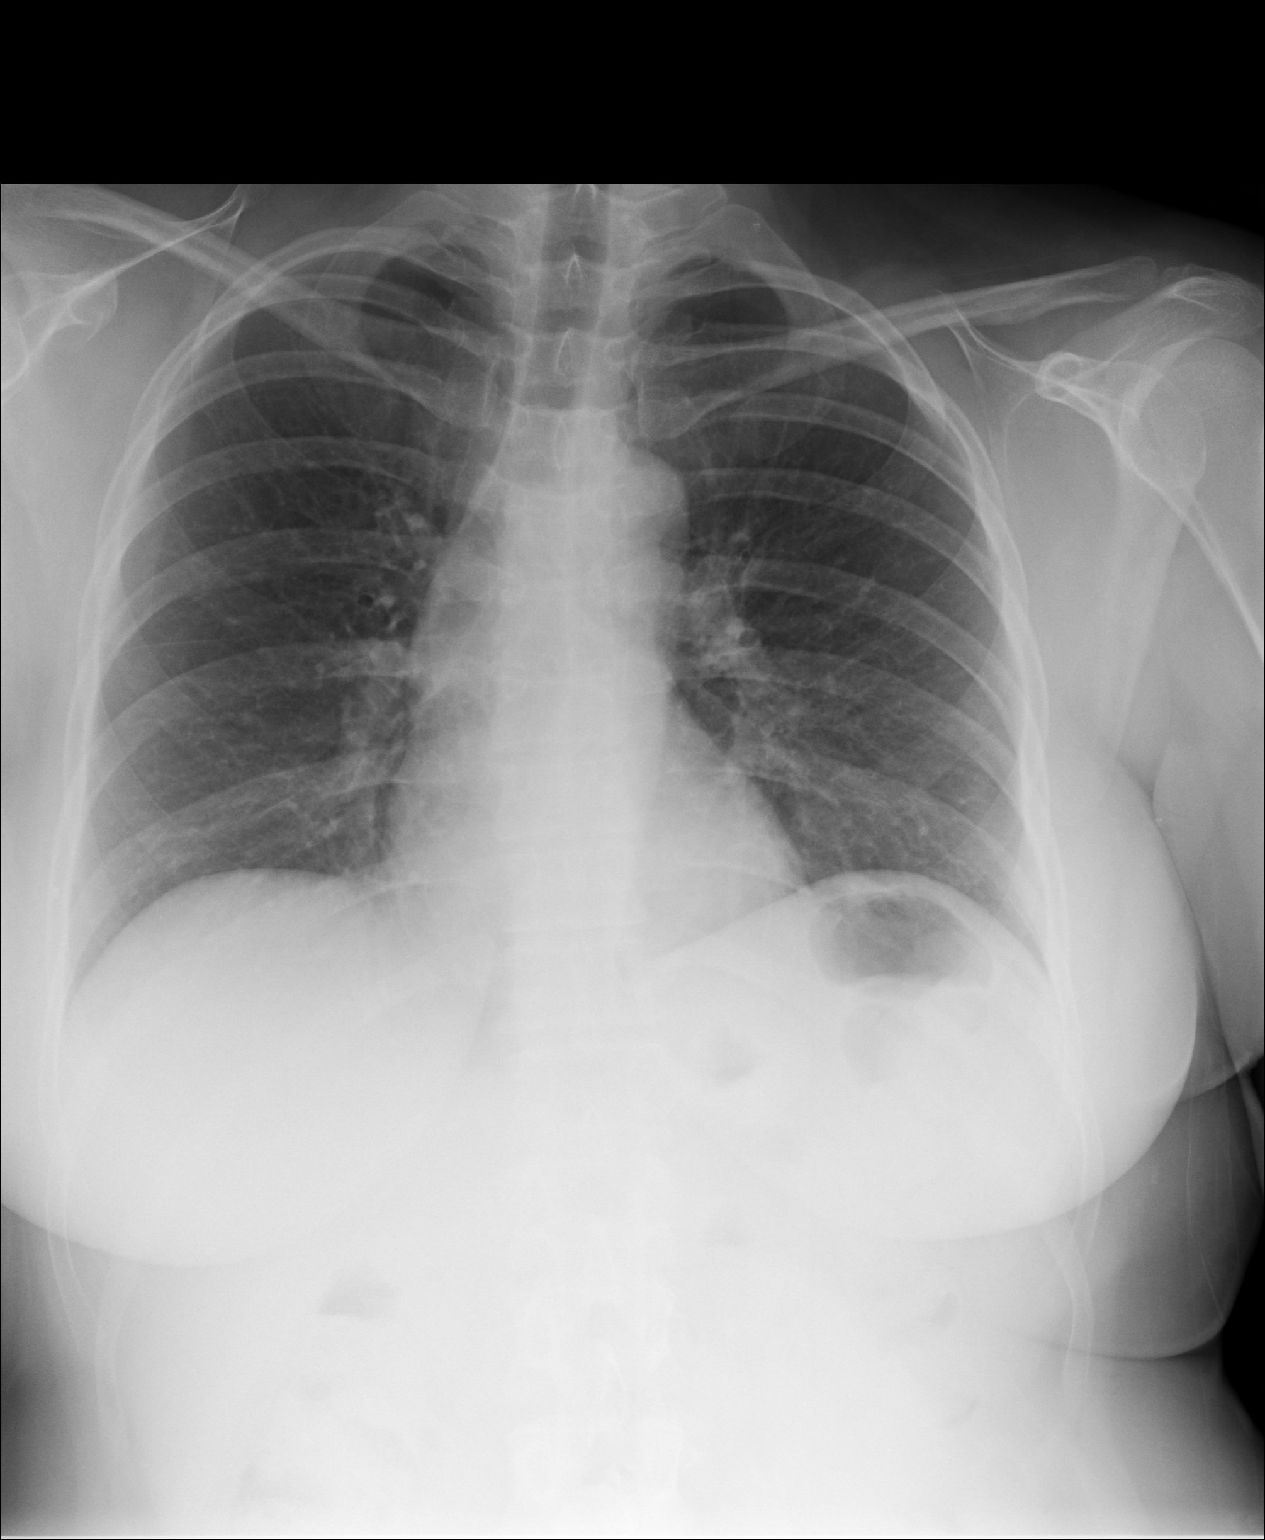

[2 of 2 positions shown; findings below may reference images not displayed]

IMPRESSION: No acute cardiopulmonary disease.

## 2013-07-11 ENCOUNTER — Ambulatory Visit: Payer: Self-pay | Admitting: Family Medicine

## 2013-08-02 ENCOUNTER — Ambulatory Visit: Payer: Self-pay | Admitting: Allergy

## 2014-10-16 LAB — HM PAP SMEAR: HM PAP: POSITIVE

## 2014-10-27 ENCOUNTER — Other Ambulatory Visit: Payer: Self-pay

## 2014-10-27 DIAGNOSIS — Z1231 Encounter for screening mammogram for malignant neoplasm of breast: Secondary | ICD-10-CM

## 2014-11-10 ENCOUNTER — Ambulatory Visit
Admission: RE | Admit: 2014-11-10 | Discharge: 2014-11-10 | Disposition: A | Discharge status: Home / Self Care | Payer: BLUE CROSS/BLUE SHIELD | Source: Ambulatory Visit | Attending: Unknown Physician Specialty | Admitting: Unknown Physician Specialty

## 2014-11-10 DIAGNOSIS — Z1231 Encounter for screening mammogram for malignant neoplasm of breast: Secondary | ICD-10-CM | POA: Insufficient documentation

## 2014-11-10 LAB — HM MAMMOGRAPHY: HM MAMMO: NORMAL (ref 0–4)

## 2014-12-02 ENCOUNTER — Encounter: Payer: Self-pay | Admitting: Obstetrics and Gynecology

## 2014-12-02 ENCOUNTER — Other Ambulatory Visit: Payer: Self-pay | Admitting: Obstetrics and Gynecology

## 2014-12-02 DIAGNOSIS — Z1231 Encounter for screening mammogram for malignant neoplasm of breast: Secondary | ICD-10-CM

## 2014-12-02 NOTE — Progress Notes (Signed)
Quick Note:  Please let her know I have reviewed her MMG and it is negative ______

## 2015-07-29 ENCOUNTER — Telehealth: Payer: Self-pay | Admitting: Gastroenterology

## 2015-07-30 NOTE — Telephone Encounter (Signed)
The patient drinks Miralax daily as constipation is a long term issue. In the last 2 weeks it has not seemed to work. She has used Mag Citrate and a Ducolax suppository twice this week. She has passed "moderate amount of liquid stool. Continues to has abdominal aching and feeling constipated. Appointment scheduled for evaluation and treatment.

## 2015-08-12 ENCOUNTER — Ambulatory Visit (INDEPENDENT_AMBULATORY_CARE_PROVIDER_SITE_OTHER): Payer: BLUE CROSS/BLUE SHIELD | Admitting: Physician Assistant

## 2015-08-12 ENCOUNTER — Encounter: Payer: Self-pay | Admitting: Physician Assistant

## 2015-08-12 VITALS — BP 130/80 | HR 70 | Ht 63.0 in | Wt 150.0 lb

## 2015-08-12 DIAGNOSIS — K5909 Other constipation: Secondary | ICD-10-CM | POA: Diagnosis not present

## 2015-08-12 DIAGNOSIS — K589 Irritable bowel syndrome without diarrhea: Secondary | ICD-10-CM | POA: Diagnosis not present

## 2015-08-12 MED ORDER — LUBIPROSTONE 8 MCG PO CAPS: 8.0000 ug | ORAL_CAPSULE | Freq: Two times a day (BID) | ORAL | Status: DC

## 2015-08-12 NOTE — Progress Notes (Signed)
Patient ID: Lindsay Lane, female   DOB: February 17, 1961, 55 y.o.   MRN: [id number]    HPI:  Lindsay Lane is a 55 y.o.   female who has previously been followed by [name]. She had a colonoscopy in January 2012 at which time mild diverticulosis was found in the sigmoid. Internal hemorrhoids were found. She had had a previous colonoscopy greater than 5 years prior to that colonoscopy, at which time she had adenomatous polyps. Due to this history, she was advised to have her surveillance colonoscopy in 7 years which would make her do in January 2019.  She is here today with complaints of constipation. She states that she has been constipated all of her life. About 10 years ago. She states she was evaluated by a physician in [place] and was started on Mira lax one capful daily. This helped and she had a bowel movement every day to every other day. She would occasionally still have constipation but it was very infrequent. She states 3 or 4 weeks ago she had an episode where she went to weeks without a bowel movement. She tried increasing her Mira lax to twice a day but had no relief. She drank prune juice and ate Crohn's and it provided no relief. She drank a bottle of mag citrate and had a watery bowel movement. She tried 2 Fleet's enemas and had no relief. She called our office and was told to use 7 capfuls of Mira lax in Gatorade. She states she then had a large watery bowel movement. That was one week ago. Since then she has had either no bowel movement or small nuggets. She states about 3 or 4 weeks ago she started Weight Watchers and has been eating healthy but has had increasing difficulty moving her bowels. She has had no bright red blood per rectum or melena. She has no abdominal pain.   Past Medical History  Diagnosis Date  . Seasonal allergies   . Reflux     Past Surgical History  Procedure Laterality Date  . Back surgery  1998, 2000    x 2  . Nasal sinus surgery  11/2011  . Vesicovaginal  fistula closure w/ tah  2007  . Abdominal hysterectomy  2008   Family History  Problem Relation Age of Onset  . Emphysema Paternal Grandfather     was a smoker  . Emphysema Maternal Grandfather     was a smoker  . Allergies Mother   . Allergies Sister   . Heart disease Father   . Lymphoma Father   . Colon cancer Maternal Grandmother   . Colon cancer Maternal Grandfather    Social History  Substance Use Topics  . Smoking status: Former Smoker -- 0.30 packs/day for 20 years    Types: Cigarettes    Quit date: 03/21/2011  . Smokeless tobacco: Never Used  . Alcohol Use: No   Current Outpatient Prescriptions  Medication Sig Dispense Refill  . Ascorbic Acid (VITAMIN C) 100 MG tablet Take 100 mg by mouth daily.    . cholecalciferol (VITAMIN D) 1000 units tablet Take 1,000 Units by mouth daily.    . Multiple Vitamins-Minerals (MULTIVITAMIN WITH MINERALS) tablet Take 1 tablet by mouth daily.    Marland Kitchen albuterol (PROVENTIL HFA;VENTOLIN HFA) 108 (90 BASE) MCG/ACT inhaler Inhale 2 puffs into the lungs every 6 (six) hours as needed. Every 2-3 hours    . cyanocobalamin 2000 MCG tablet Take 2,000 mcg by mouth daily.    Marland Kitchen  lubiprostone (AMITIZA) 8 MCG capsule Take 1 capsule (8 mcg total) by mouth 2 (two) times daily with a meal. 60 capsule 3  . mometasone (NASONEX) 50 MCG/ACT nasal spray Place 2 sprays into the nose 2 (two) times daily.    . mometasone-formoterol (DULERA) 100-5 MCG/ACT AERO Take 2 puffs first thing in am and then another 2 puffs about 12 hours later as needed    . montelukast (SINGULAIR) 10 MG tablet Take 1 tablet (10 mg total) by mouth at bedtime. 30 tablet 11  . omeprazole (PRILOSEC OTC) 20 MG tablet Take 20 mg by mouth daily before breakfast.     . traMADol (ULTRAM) 50 MG tablet 1-2 every 4 hours as needed for cough or pain 40 tablet 0   No current facility-administered medications for this visit.   No Known Allergies   Review of Systems: Gen: Denies any fever, chills,  sweats, anorexia, fatigue, weakness, malaise, weight loss, and sleep disorder CV: Denies chest pain, angina, palpitations, syncope, orthopnea, PND, peripheral edema, and claudication. Resp: Denies dyspnea at rest, dyspnea with exercise, cough, sputum, wheezing, coughing up blood, and pleurisy. GI: Denies vomiting blood, jaundice, and fecal incontinence.   Denies dysphagia or odynophagia. GU : Denies urinary burning, blood in urine, urinary frequency, urinary hesitancy, nocturnal urination, and urinary incontinence. MS: Denies joint pain, limitation of movement, and swelling, stiffness, low back pain, extremity pain. Denies muscle weakness, cramps, atrophy.  Derm: Denies rash, itching, dry skin, hives, moles, warts, or unhealing ulcers.  Psych: Denies depression, anxiety, memory loss, suicidal ideation, hallucinations, paranoia, and confusion. Heme: Denies bruising, bleeding, and enlarged lymph nodes. Neuro:  Denies any headaches, dizziness, paresthesias. Endo:  Denies any problems with DM, thyroid, adrenal function C-Diff  Prior Endoscopies:  See history of present illness  Physical Exam: BP 130/80 mmHg  Pulse 70  Ht 5\' 3"  (1.6 m)  Wt 150 lb (68.04 kg)  BMI 26.58 kg/m2 Constitutional: Pleasant,well-developed, female in no acute distress. HEENT: Normocephalic and atraumatic. Conjunctivae are normal. No scleral icterus. Neck supple.  Cardiovascular: Normal rate, regular rhythm.  Pulmonary/chest: Effort normal and breath sounds normal. No wheezing, rales or rhonchi. Abdominal: Soft, nondistended, nontender. Bowel sounds active throughout. There are no masses palpable. No hepatomegaly. Extremities: no edema Lymphadenopathy: No cervical adenopathy noted. Neurological: Alert and oriented to person place and time. Skin: Skin is warm and dry. No rashes noted. Psychiatric: Normal mood and affect. Behavior is normal.  ASSESSMENT AND PLAN: 55 year old female with a long-standing history of  constipation here with an exacerbation of her constipation that is not improved with fiber supplementation and Mira lax. She's been instructed to be sure she is drinking plenty of water. She will be given a trial of amitiza 8 g twice a day. She's been instructed to call us in 2 weeks and if she has no relief with that dose, we will increase her to 24 g twice daily. She will follow-up in 6 weeks at which time she would like to establish care with Dr. Silverio Decamp.    Dola Lunsford, Vita Barley PA-C 08/12/2015, 11:15 AM  CC: Kathrine Haddock, NP

## 2015-08-12 NOTE — Patient Instructions (Signed)
We have sent the following medications to your pharmacy for you to pick up at your convenience:  Amitiza 8 mcg twice a day. Call in 2 weeks if 8 mcg is not working we can increase the dose.   Please keep your follow up with Dr. Silverio Decamp.

## 2015-08-12 NOTE — Progress Notes (Signed)
Reviewed and agree with documentation and assessment and plan. K. Veena Nandigam , MD   

## 2015-10-12 ENCOUNTER — Ambulatory Visit (INDEPENDENT_AMBULATORY_CARE_PROVIDER_SITE_OTHER): Payer: BLUE CROSS/BLUE SHIELD | Admitting: Gastroenterology

## 2015-10-12 ENCOUNTER — Encounter: Payer: Self-pay | Admitting: Gastroenterology

## 2015-10-12 VITALS — BP 126/78 | HR 60 | Ht 63.0 in | Wt 146.5 lb

## 2015-10-12 DIAGNOSIS — K5902 Outlet dysfunction constipation: Secondary | ICD-10-CM | POA: Diagnosis not present

## 2015-10-12 MED ORDER — LINACLOTIDE 145 MCG PO CAPS: 145.0000 ug | ORAL_CAPSULE | Freq: Every day | ORAL | Status: DC

## 2015-10-12 NOTE — Patient Instructions (Signed)
You have been scheduled to have an anorectal manometry at [facility] on 10/19/2015 at 10:30am. Please arrive 30 minutes prior to your appointment time for registration (1st floor of the hospital-admissions).  Please make certain to use 1 Fleets enema 2 hours prior to coming for your appointment. You can purchase Fleets enemas from the laxative section at your drug store. You should not eat anything during the two hours prior to the procedure. You may take regular medications with small sips of water at least 2 hours prior to the study.  Anorectal manometry is a test performed to evaluate patients with constipation or fecal incontinence. This test measures the pressures of the anal sphincter muscles, the sensation in the rectum, and the neural reflexes that are needed for normal bowel movements.  THE PROCEDURE The test takes approximately 30 minutes to 1 hour. You will be asked to change into a hospital gown. A technician or nurse will explain the procedure to you, take a brief health history, and answer any questions you may have. The patient then lies on his or her left side. A small, flexible tube, about the size of a thermometer, with a balloon at the end is inserted into the rectum. The catheter is connected to a machine that measures the pressure. During the test, the small balloon attached to the catheter may be inflated in the rectum to assess the normal reflex pathways. The nurse or technician may also ask the person to squeeze, relax, and push at various times. The anal sphincter muscle pressures are measured during each of these maneuvers. To squeeze, the patient tightens the sphincter muscles as if trying to prevent anything from coming out. To push or bear down, the patient strains down as if trying to have a bowel movement.  We will send in [place] to your pharmacy

## 2015-10-14 NOTE — Progress Notes (Signed)
Lindsay Lane    [id number]    09/13/1960  Primary Care Physician:Cheryl Julian Hy, NP  Referring Physician: Kathrine Haddock, NP [place], [place] [address]  Chief complaint: Constipation HPI:  55 y.o. female who has previously followed by [name], last seen in February 2017 by Cecille Rubin Hvozdovic. She had a colonoscopy in January 2012  mild sigmoid diverticulosis and Internal hemorrhoids prior to that she had colonoscopy 5 years ago with tubular adenomas, and is on recall for colonoscopy in 2019.  She was started on Amitiza 8 g daily and increase to 16 g daily at her last visit. She notes some improvement but continues to have irregular bowel movements with incomplete evacuation. She is having 2-3 bowel movements a week and continues to do bowel purge about once a week with 7-8. MiraLAX and Gatorade. She is currently paying $300 for Amitiza and feels cannot afford to continue it. Denies any nausea, vomiting, abdominal pain, melena or bright red blood per rectum Her constipation and irregular bowel movements started when she was diagnosed with uterine fibroid and subsequently underwent hysterectomy, she sometimes feels she has to defecate but isn't not able to have a bowel movement.    Outpatient Encounter Prescriptions as of 10/12/2015  Medication Sig  . albuterol (PROVENTIL HFA;VENTOLIN HFA) 108 (90 BASE) MCG/ACT inhaler Inhale 2 puffs into the lungs every 6 (six) hours as needed. Every 2-3 hours  . amoxicillin (AMOXIL) 875 MG tablet   . Ascorbic Acid (VITAMIN C) 100 MG tablet Take 100 mg by mouth daily.  . benzonatate (TESSALON) 100 MG capsule   . cetirizine (ZYRTEC) 10 MG tablet Take 10 mg by mouth daily.  . cholecalciferol (VITAMIN D) 1000 units tablet Take 1,000 Units by mouth daily.  . cyanocobalamin 2000 MCG tablet Take 2,000 mcg by mouth daily.  Marland Kitchen lubiprostone (AMITIZA) 8 MCG capsule Take 1 capsule (8 mcg total) by mouth 2 (two) times daily with a meal.  . mometasone  (NASONEX) 50 MCG/ACT nasal spray Place 2 sprays into the nose 2 (two) times daily.  . mometasone-formoterol (DULERA) 100-5 MCG/ACT AERO Take 2 puffs first thing in am and then another 2 puffs about 12 hours later as needed  . montelukast (SINGULAIR) 10 MG tablet Take 1 tablet (10 mg total) by mouth at bedtime.  . Multiple Vitamins-Minerals (MULTIVITAMIN WITH MINERALS) tablet Take 1 tablet by mouth daily.  Marland Kitchen omeprazole (PRILOSEC OTC) 20 MG tablet Take 20 mg by mouth daily before breakfast.   . [DISCONTINUED] traMADol (ULTRAM) 50 MG tablet 1-2 every 4 hours as needed for cough or pain  . linaclotide (LINZESS) 145 MCG CAPS capsule Take 1 capsule (145 mcg total) by mouth daily before breakfast.   No facility-administered encounter medications on file as of 10/12/2015.    Allergies as of 10/12/2015  . (No Known Allergies)    Past Medical History  Diagnosis Date  . Seasonal allergies   . Reflux   . DDD (degenerative disc disease), lumbar   . Ruptured disk   . GERD (gastroesophageal reflux disease)   . Fibroids   . History of heavy periods     Past Surgical History  Procedure Laterality Date  . Back surgery  1998, 2000    x 2  . Nasal sinus surgery  11/2011  . Vesicovaginal fistula closure w/ tah  2007  . Abdominal hysterectomy  2008    Family History  Problem Relation Age of Onset  .  Emphysema Paternal Grandfather     was a smoker  . Emphysema Maternal Grandfather     was a smoker  . Colon cancer Maternal Grandfather   . Allergies Mother   . Allergies Sister   . Heart disease Father   . Lymphoma Father   . Colon cancer Maternal Grandmother   . Breast cancer Neg Hx   . Ovarian cancer Neg Hx     Social History   Social History  . Marital Status: Single    Spouse Name: N/A  . Number of Children: N/A  . Years of Education: N/A   Occupational History  . Glass blower/designer    Social History Main Topics  . Smoking status: Former Smoker -- 0.30 packs/day for 20 years     Types: Cigarettes    Quit date: 03/21/2011  . Smokeless tobacco: Never Used  . Alcohol Use: No  . Drug Use: No  . Sexual Activity: Not Currently   Other Topics Concern  . Not on file   Social History Narrative      Review of systems: Review of Systems  Constitutional: Negative for fever and chills.  HENT: Negative.   Eyes: Negative for blurred vision.  Respiratory: Negative for cough, shortness of breath and wheezing.   Cardiovascular: Negative for chest pain and palpitations.  Gastrointestinal: as per HPI Genitourinary: Negative for dysuria, urgency, frequency and hematuria.  Musculoskeletal: Negative for myalgias, back pain and joint pain.  Skin: Negative for itching and rash.  Neurological: Negative for dizziness, tremors, focal weakness, seizures and loss of consciousness.  Endo/Heme/Allergies: Negative for environmental allergies.  Psychiatric/Behavioral: Negative for depression, suicidal ideas and hallucinations.  All other systems reviewed and are negative.   Physical Exam: Filed Vitals:   10/12/15 1532  BP: 126/78  Pulse: 60   Gen:      No acute distress HEENT:  EOMI, sclera anicteric Neck:     No masses; no thyromegaly Lungs:    Clear to auscultation bilaterally; normal respiratory effort CV:         Regular rate and rhythm; no murmurs Abd:      + bowel sounds; soft, non-tender; no palpable masses, no distension Ext:    No edema; adequate peripheral perfusion Skin:      Warm and dry; no rash Neuro: alert and oriented x 3 Psych: normal mood and affect  Data Reviewed:  Reviewed chart in epic   Assessment and Plan/Recommendations:  55 year old female with chronic constipation here for follow-up visit. She likely has a component of outlet dysfunction/dyssynergia defecation We'll schedule for anorectal manometry to evaluate and will consider biofeedback and pelvic floor physical therapy based on the results Start Linzess 117mcg daily and will adjust dose  accordingly based on response Return after the procedure  K. Denzil Magnuson , MD [phone] Mon-Fri 8a-5p [phone] after 5p, weekends, holidays

## 2015-10-20 ENCOUNTER — Encounter: Payer: Self-pay | Admitting: Obstetrics and Gynecology

## 2015-10-20 ENCOUNTER — Ambulatory Visit (INDEPENDENT_AMBULATORY_CARE_PROVIDER_SITE_OTHER): Payer: BLUE CROSS/BLUE SHIELD | Admitting: Obstetrics and Gynecology

## 2015-10-20 VITALS — BP 122/78 | HR 58 | Ht 63.0 in | Wt 144.0 lb

## 2015-10-20 DIAGNOSIS — N8110 Cystocele, unspecified: Secondary | ICD-10-CM

## 2015-10-20 DIAGNOSIS — N952 Postmenopausal atrophic vaginitis: Secondary | ICD-10-CM | POA: Diagnosis not present

## 2015-10-20 DIAGNOSIS — Z1239 Encounter for other screening for malignant neoplasm of breast: Secondary | ICD-10-CM

## 2015-10-20 DIAGNOSIS — N763 Subacute and chronic vulvitis: Secondary | ICD-10-CM | POA: Diagnosis not present

## 2015-10-20 DIAGNOSIS — Z1211 Encounter for screening for malignant neoplasm of colon: Secondary | ICD-10-CM

## 2015-10-20 DIAGNOSIS — Z780 Asymptomatic menopausal state: Secondary | ICD-10-CM | POA: Diagnosis not present

## 2015-10-20 DIAGNOSIS — N816 Rectocele: Secondary | ICD-10-CM | POA: Diagnosis not present

## 2015-10-20 DIAGNOSIS — IMO0002 Reserved for concepts with insufficient information to code with codable children: Secondary | ICD-10-CM | POA: Insufficient documentation

## 2015-10-20 DIAGNOSIS — N393 Stress incontinence (female) (male): Secondary | ICD-10-CM | POA: Diagnosis not present

## 2015-10-20 DIAGNOSIS — Z01419 Encounter for gynecological examination (general) (routine) without abnormal findings: Secondary | ICD-10-CM

## 2015-10-20 MED ORDER — ESTROGENS, CONJUGATED 0.625 MG/GM VA CREA: 0.5000 | TOPICAL_CREAM | Freq: Every day | VAGINAL | Status: DC

## 2015-10-20 NOTE — Patient Instructions (Signed)
1. Pap smear is done today. If positive HPV is present, colposcopy will be needed 2. Mammogram is ordered 3. Stool guaiac cards are given for colon cancer screening 4. Patient is to return in 2 weeks for vulvar biopsy due to chronic vulvar itching. Patient is encouraged to shave her labia prior to coming in for appointment to streamline biopsies 5. Physical therapy consultation for stress urinary incontinence and chronic constipation is scheduled 6. Premarin cream 1/2 g intravaginal twice a week is recommended for vaginal atrophy therapy 7. Patient is encouraged to take Colace 100 mg twice a day, continue with Benefiber daily, and increase water intake to 8 glasses a day to help offset constipation 8. Return in 1 year for annual exam

## 2015-10-20 NOTE — Progress Notes (Signed)
Patient ID: Lindsay Lane, female   DOB: 10/29/1960, 55 y.o.   MRN: [id number] ANNUAL PREVENTATIVE CARE GYN  ENCOUNTER NOTE  Subjective:       Lindsay Lane is a 55 y.o. G73P1001 female here for a routine annual gynecologic exam.  Current complaints: 1.  Itching at times on vulva   55 yo G1P1001, s/p partial abdominal hyserectomy, post-menopausal female, not on HRT, presents for well woman exam, with complaints of regular vulvar itching, and urinary leakage, mostly at night, associated with coughing, sneezing and laughing, requiring regular use of pads.  Patient uses Epsom salt baths for vulvar itching with minimal improvement.   Patient did not follow up on abnormal Pap screen last year, because states she was "unaware it was abnormal." Notes in medical record of multiple attempts to contact patient about abnormal screening.  Gynecologic History No LMP recorded. Patient has had a hysterectomy. Contraception: status post hysterectomy Last Pap: 10/16/2014 ascus/pos. Results were: abnormal Last mammogram: 2016. Results were: normal  Obstetric History OB History  Gravida Para Term Preterm AB SAB TAB Ectopic Multiple Living  1 1 1       1     # Outcome Date GA Lbr Len/2nd Weight Sex Delivery Anes PTL Lv  1 Term    9 lb 4.8 oz (4.218 kg) F Vag-Spont   Y      Past Medical History  Diagnosis Date  . Seasonal allergies   . Reflux   . DDD (degenerative disc disease), lumbar   . Ruptured disk   . GERD (gastroesophageal reflux disease)   . Fibroids   . History of heavy periods     Past Surgical History  Procedure Laterality Date  . Back surgery  1998, 2000    x 2  . Nasal sinus surgery  11/2011  . Vesicovaginal fistula closure w/ tah  2007  . Abdominal hysterectomy  2008    Current Outpatient Prescriptions on File Prior to Visit  Medication Sig Dispense Refill  . albuterol (PROVENTIL HFA;VENTOLIN HFA) 108 (90 BASE) MCG/ACT inhaler Inhale 2 puffs into the lungs every 6 (six) hours as  needed. Every 2-3 hours    . Ascorbic Acid (VITAMIN C) 100 MG tablet Take 100 mg by mouth daily.    . cetirizine (ZYRTEC) 10 MG tablet Take 10 mg by mouth daily.    . cholecalciferol (VITAMIN D) 1000 units tablet Take 1,000 Units by mouth daily.    . cyanocobalamin 2000 MCG tablet Take 2,000 mcg by mouth daily.    . mometasone-formoterol (DULERA) 100-5 MCG/ACT AERO Take 2 puffs first thing in am and then another 2 puffs about 12 hours later as needed    . Multiple Vitamins-Minerals (MULTIVITAMIN WITH MINERALS) tablet Take 1 tablet by mouth daily.    Marland Kitchen omeprazole (PRILOSEC OTC) 20 MG tablet Take 20 mg by mouth daily before breakfast.      No current facility-administered medications on file prior to visit.    No Known Allergies  Social History   Social History  . Marital Status: Single    Spouse Name: N/A  . Number of Children: N/A  . Years of Education: N/A   Occupational History  . Glass blower/designer    Social History Main Topics  . Smoking status: Former Smoker -- 0.30 packs/day for 20 years    Types: Cigarettes    Quit date: 03/21/2011  . Smokeless tobacco: Never Used  . Alcohol Use: Yes     Comment: occas  .  Drug Use: No  . Sexual Activity: Not Currently    Birth Control/ Protection: Surgical   Other Topics Concern  . Not on file   Social History Narrative    Family History  Problem Relation Age of Onset  . Emphysema Paternal Grandfather     was a smoker  . Emphysema Maternal Grandfather     was a smoker  . Colon cancer Maternal Grandfather   . Allergies Mother   . Diabetes Mother   . Allergies Sister   . Heart disease Father   . Lymphoma Father   . Colon cancer Maternal Grandmother   . Breast cancer Neg Hx   . Ovarian cancer Neg Hx     The following portions of the patient's history were reviewed and updated as appropriate: allergies, current medications, past family history, past medical history, past social history, past surgical history and problem  list.  Review of Systems Review of Systems  Constitutional: Negative.   HENT: Positive for congestion.   Eyes: Negative.   Respiratory: Positive for cough. Negative for hemoptysis, sputum production, shortness of breath and wheezing.   Cardiovascular: Negative.   Gastrointestinal: Negative.   Genitourinary: Negative.   Musculoskeletal: Negative.   Skin: Negative.   Neurological: Negative.   Endo/Heme/Allergies: Positive for environmental allergies.  Psychiatric/Behavioral: Negative.    Review of Systems - General ROS: negative for - chills, fatigue, fever, hot flashes, night sweats, weight gain or weight loss Psychological ROS: negative for - anxiety, decreased libido, depression, mood swings, physical abuse or sexual abuse Ophthalmic ROS: negative for - blurry vision, eye pain or loss of vision ENT ROS: negative for - headaches, hearing change, visual changes or vocal changes Allergy and Immunology ROS: negative for - hives, itchy/watery eyes or seasonal allergies Hematological and Lymphatic ROS: negative for - bleeding problems, bruising, swollen lymph nodes or weight loss Endocrine ROS: negative for - galactorrhea, hair pattern changes, hot flashes, malaise/lethargy, mood swings, palpitations, polydipsia/polyuria, skin changes, temperature intolerance or unexpected weight changes Breast ROS: negative for - new or changing breast lumps or nipple discharge Respiratory ROS: negative for - cough or shortness of breath Cardiovascular ROS: negative for - chest pain, irregular heartbeat, palpitations or shortness of breath Gastrointestinal ROS: no abdominal pain, change in bowel habits, or black or bloody stools Genito-Urinary ROS: no dysuria, trouble voiding, or hematuria Musculoskeletal ROS: negative for - joint pain or joint stiffness Neurological ROS: negative for - bowel and bladder control changes Dermatological ROS: negative for rash and skin lesion changes   Objective:   BP  122/78 mmHg  Pulse 58  Ht 5\' 3"  (1.6 m)  Wt 144 lb (65.318 kg)  BMI 25.51 kg/m2 CONSTITUTIONAL: Well-developed, well-nourished female in no acute distress.  PSYCHIATRIC: Normal mood and affect. Normal behavior. Normal judgment and thought content. [place]: Alert and oriented to person, place, and time. Normal muscle tone coordination. No cranial nerve deficit noted. HENT:  Normocephalic, atraumatic, External right and left ear normal. Oropharynx is clear and moist EYES: Conjunctivae and EOM are normal. Pupils are equal, round, and reactive to light. No scleral icterus.  NECK: Normal range of motion, supple, no masses.  Normal thyroid.  SKIN: Skin is warm and dry. No rash noted. Not diaphoretic. No erythema. No pallor. CARDIOVASCULAR: Normal heart rate noted, regular rhythm, no murmur, rubs or gallops. RESPIRATORY: Clear to auscultation bilaterally. Effort and breath sounds normal, no problems with respiration noted. BREASTS: Symmetric in size. No masses, skin changes, nipple drainage, or lymphadenopathy. ABDOMEN: Soft,  normal bowel sounds, no distention noted.  No tenderness, rebound or guarding.  BLADDER: Normal PELVIC:  External Genitalia: leukoplakia present on labia majora bilaterally  BUS: Normal  Vagina: moderate vaginal atrophy, pale tissues, lack of rugae; 1 degree cystocele, mild-moderate rectocele  Cervix: normal, no lesions noted  Uterus: surgically absent  Adnexa: Normal, no tenderness  RV: External Exam NormaI, No Rectal Masses and Normal Sphincter tone  MUSCULOSKELETAL: Normal range of motion. No tenderness.  No cyanosis, clubbing, or edema.  2+ distal pulses. LYMPHATIC: No Axillary, Supraclavicular, or Inguinal Adenopathy.    Assessment:   Annual gynecologic examination 55 y.o. Contraception: status post hysterectomy Normal BMI  1. 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 Stress incontinence w/1st degree cystocele 3. Chronic constipation with  mild-moderate rectocele 4. Vaginal atrophy 5. Hx Abnormal Pap smear: ASCUS, + HPV  Plan:  Pap: Pap Co Test Mammogram: Ordered Stool Guaiac Testing:  Ordered Labs: vit d tsh lipid a1c fbs Routine preventative health maintenance measures emphasized: Discussed importance of Vit D, Calcium supplements and weight bearing exercises in the prevention of osteoporosis., Exercise/Diet/Weight control, Tobacco Warnings, Alcohol/Substance use risks and Stress Management  1. Pap smear is done today. If positive HPV is present, colposcopy will be needed 2. Patient is to return in 2 weeks for vulvar biopsy due to chronic vulvar itching. Patient is encouraged to shave her labia prior to coming in for appointment to streamline biopsies 3. Physical therapy consultation for stress urinary incontinence and chronic constipation is scheduled 4. Suggested Kegel exercises and timed voiding for stress urinary incontinence improvement 5. Premarin cream 1/2 g intravaginal twice a week is recommended for vaginal atrophy therapy 6. Patient is encouraged to take Colace 100 mg twice a day, continue with Benefiber daily, and increase water intake to 8 glasses a day to help offset constipation   Return in 1 year for annual exam   I have seen, interviewed, and examined the patient in conjunction with the [place].A. student and affirm the diagnosis and management plan. Ondine Gemme A. Symphonie Schneiderman, MD, [address], CMA  Dicky Doe. Penninger, PA-S Brayton Mars, MD

## 2015-10-21 LAB — LIPID PANEL
Chol/HDL Ratio: 3.5 ratio units (ref 0.0–4.4)
Cholesterol, Total: 176 mg/dL (ref 100–199)
HDL: 51 mg/dL (ref >39)
LDL Calculated: 98 mg/dL (ref 0–99)
Triglycerides: 137 mg/dL (ref 0–149)
VLDL CHOLESTEROL CAL: 27 mg/dL (ref 5–40)

## 2015-10-21 LAB — HEMOGLOBIN A1C
ESTIMATED AVERAGE GLUCOSE: 111 mg/dL
HEMOGLOBIN A1C: 5.5 % (ref 4.8–5.6)

## 2015-10-21 LAB — GLUCOSE, RANDOM: GLUCOSE: 74 mg/dL (ref 65–99)

## 2015-10-21 LAB — VITAMIN D 25 HYDROXY (VIT D DEFICIENCY, FRACTURES): VIT D 25 HYDROXY: 37.8 ng/mL (ref 30.0–100.0)

## 2015-10-21 LAB — TSH: TSH: 1.92 u[IU]/mL (ref 0.450–4.500)

## 2015-10-26 ENCOUNTER — Encounter (HOSPITAL_COMMUNITY)
Admission: RE | Disposition: A | Discharge status: Home / Self Care | Payer: Self-pay | Source: Ambulatory Visit | Attending: Gastroenterology

## 2015-10-26 ENCOUNTER — Ambulatory Visit (HOSPITAL_COMMUNITY)
Admission: RE | Admit: 2015-10-26 | Discharge: 2015-10-26 | Disposition: A | Discharge status: Home / Self Care | Payer: BLUE CROSS/BLUE SHIELD | Source: Ambulatory Visit | Attending: Gastroenterology | Admitting: Gastroenterology

## 2015-10-26 DIAGNOSIS — K5909 Other constipation: Secondary | ICD-10-CM | POA: Insufficient documentation

## 2015-10-26 HISTORY — PX: ANAL RECTAL MANOMETRY: SHX6358

## 2015-10-26 LAB — PAP IG AND HPV HIGH-RISK
HPV, HIGH-RISK: NEGATIVE
PAP Smear Comment: 0

## 2015-10-26 SURGERY — MANOMETRY, ANORECTAL

## 2015-10-26 NOTE — Progress Notes (Signed)
Anal manometry done per protocol with balloon expulsion test. Pt tolerated well with no complications. Report to be sent to Dr. Silverio Decamp.

## 2015-10-27 ENCOUNTER — Telehealth: Payer: Self-pay | Admitting: *Deleted

## 2015-10-27 ENCOUNTER — Encounter (HOSPITAL_COMMUNITY): Payer: Self-pay | Admitting: Gastroenterology

## 2015-10-27 NOTE — Telephone Encounter (Signed)
Patient called and was inquiring about her Pap results. Patient is requesting a call back. Call back number is (work) [phone] and (cell) [phone]. Thanks

## 2015-10-27 NOTE — Telephone Encounter (Signed)
Pt aware per work phone vm- pap wnl.

## 2015-11-04 LAB — FECAL OCCULT BLOOD, IMMUNOCHEMICAL: Fecal Occult Bld: NEGATIVE

## 2015-11-10 ENCOUNTER — Ambulatory Visit (INDEPENDENT_AMBULATORY_CARE_PROVIDER_SITE_OTHER): Payer: BLUE CROSS/BLUE SHIELD | Admitting: Obstetrics and Gynecology

## 2015-11-10 ENCOUNTER — Encounter: Payer: Self-pay | Admitting: Obstetrics and Gynecology

## 2015-11-10 VITALS — BP 152/87 | HR 68 | Ht 63.0 in | Wt 148.3 lb

## 2015-11-10 DIAGNOSIS — N763 Subacute and chronic vulvitis: Secondary | ICD-10-CM

## 2015-11-10 NOTE — Progress Notes (Signed)
Chief complaint: 1. Vulvar biopsy  INDICATIONS: Chronic vulvitis PROCEDURE: Vulvar biopsy 2  1. Posterior fourchette  2. Right labia majora DESCRIPTION: Patient is placed in the dorsal lithotomy position. Consent is given. The biopsy sites are cleansed with Betadine solution. 2 cc of lidocaine without epinephrine injected topically to both biopsy sites for anesthetic purposes. 3.5 mm punch biopsy is taken of the posterior fourchette as well as on the right labia majora. Hemostasis is obtained with simple interrupted sutures of 3-0 Vicryl rapide. Estimated blood loss minimal. Complications none. Procedure was well-tolerated. DISPOSITION: 1. Local wound care discussed. Instructions given 2. Ibuprofen and or Tylenol as needed for discomfort 3. Return in 1 week for follow-up and further management planning  Brayton Mars, MD  Note: This dictation was prepared with Dragon dictation along with smaller phrase technology. Any transcriptional errors that result from this process are unintentional.

## 2015-11-10 NOTE — Addendum Note (Signed)
Addended by: Elouise Munroe on: 11/10/2015 02:54 PM   Modules accepted: Orders

## 2015-11-10 NOTE — Patient Instructions (Signed)

## 2015-11-12 ENCOUNTER — Ambulatory Visit
Admission: RE | Admit: 2015-11-12 | Discharge: 2015-11-12 | Disposition: A | Discharge status: Home / Self Care | Payer: BLUE CROSS/BLUE SHIELD | Source: Ambulatory Visit | Attending: Obstetrics and Gynecology | Admitting: Obstetrics and Gynecology

## 2015-11-12 ENCOUNTER — Other Ambulatory Visit: Payer: Self-pay | Admitting: Obstetrics and Gynecology

## 2015-11-12 DIAGNOSIS — Z1231 Encounter for screening mammogram for malignant neoplasm of breast: Secondary | ICD-10-CM | POA: Diagnosis not present

## 2015-11-12 DIAGNOSIS — Z1239 Encounter for other screening for malignant neoplasm of breast: Secondary | ICD-10-CM

## 2015-11-17 ENCOUNTER — Ambulatory Visit (INDEPENDENT_AMBULATORY_CARE_PROVIDER_SITE_OTHER): Payer: BLUE CROSS/BLUE SHIELD | Admitting: Obstetrics and Gynecology

## 2015-11-17 ENCOUNTER — Encounter: Payer: Self-pay | Admitting: Obstetrics and Gynecology

## 2015-11-17 ENCOUNTER — Telehealth: Payer: Self-pay

## 2015-11-17 VITALS — BP 136/83 | HR 73 | Ht 63.0 in | Wt 144.5 lb

## 2015-11-17 DIAGNOSIS — N763 Subacute and chronic vulvitis: Secondary | ICD-10-CM | POA: Diagnosis not present

## 2015-11-17 LAB — PATHOLOGY

## 2015-11-17 MED ORDER — CLOBETASOL PROPIONATE 0.05 % EX OINT: 1.0000 "application " | TOPICAL_OINTMENT | Freq: Two times a day (BID) | CUTANEOUS | Status: DC

## 2015-11-17 NOTE — Patient Instructions (Signed)
1. Patient will be notified of pathology results this afternoon from vulvar biopsies 2. Continue with local wound care. 3. Further instructions will be given following pathology review

## 2015-11-17 NOTE — Telephone Encounter (Signed)
PT AWARE. MED ERX.

## 2015-11-17 NOTE — Telephone Encounter (Signed)
-----   Message from Brayton Mars, MD sent at 11/17/2015  1:49 PM EDT ----- Please notify - Abnormal Labs Biopsies are consistent with chronic vulvitis (inflammation) without evidence of precancer or cancer. Recommend Temovate ointment 0.05% to be applied topically to call or twice a day for 1 week then once a day for 1 week. This treatment should resolve vulvar itching.

## 2015-11-17 NOTE — Progress Notes (Signed)
Chief complaint: 1. Follow-up on vulvar biopsies  Patient presents for follow-up after vulvar biopsy 21 week ago. Pathology is pending. Patient is doing well with minimal discomfort at the biopsy sites. No significant bleeding or drainage has been encountered.  OBJECTIVE: BP 136/83 mmHg  Pulse 73  Ht 5\' 3"  (1.6 m)  Wt 144 lb 8 oz (65.545 kg)  BMI 25.60 kg/m2 Pleasant female in no acute distress Pelvic exam: External genitalia-right labia majora and posterior fourchette biopsy sites are healing well; residual suture is present; no significant induration or erythema  ASSESSMENT: 1. Chronic vulvitis, status post vulvar biopsy 2 2. Normal wound check 3. Pathology pending  PLAN: 1. Continue with local wound care 2. Patient will be notified of biopsy results when available later today  A total of 15 minutes were spent face-to-face with the patient during this encounter and over half of that time dealt with counseling and coordination of care.  Brayton Mars, MD  Note: This dictation was prepared with Dragon dictation along with smaller phrase technology. Any transcriptional errors that result from this process are unintentional.

## 2015-12-02 ENCOUNTER — Encounter: Payer: Self-pay | Admitting: Physical Therapy

## 2015-12-02 ENCOUNTER — Ambulatory Visit: Payer: BLUE CROSS/BLUE SHIELD | Attending: Obstetrics and Gynecology | Admitting: Physical Therapy

## 2015-12-02 DIAGNOSIS — M6281 Muscle weakness (generalized): Secondary | ICD-10-CM | POA: Diagnosis present

## 2015-12-02 DIAGNOSIS — R279 Unspecified lack of coordination: Secondary | ICD-10-CM | POA: Diagnosis present

## 2015-12-02 DIAGNOSIS — N393 Stress incontinence (female) (male): Secondary | ICD-10-CM

## 2015-12-02 DIAGNOSIS — R293 Abnormal posture: Secondary | ICD-10-CM | POA: Diagnosis present

## 2015-12-02 NOTE — Patient Instructions (Signed)
Decrease pressure on pelvic floor   Proper sitting posture: feet under knees   Log rolling out of bed    Diaphragmatic breathing with pelvic floor movement Deep core level 1 (10 reps x 3)    (Handouts)

## 2015-12-03 NOTE — Therapy (Addendum)
[place] MAIN [facility] SERVICES [address] [place], [place], [address] Phone: [phone]   Fax:  [phone]  Physical Therapy Evaluation  Patient Details  Name: Lindsay Lane MRN: [id number] Date of Birth: Jun 03, 1961 Referring Provider: DeFranscesco  Encounter Date: 12/02/2015      PT End of Session - 12/16/15 1349    Visit Number 1   Number of Visits 12   Date for PT Re-Evaluation 02/17/16   PT Start Time 1300   PT Stop Time 1410   PT Time Calculation (min) 70 min   Activity Tolerance Patient tolerated treatment well;No increased pain   Behavior During Therapy [facility] for tasks assessed/performed      Past Medical History  Diagnosis Date  . Seasonal allergies   . Reflux   . DDD (degenerative disc disease), lumbar   . Ruptured disk   . GERD (gastroesophageal reflux disease)   . Fibroids   . History of heavy periods     Past Surgical History  Procedure Laterality Date  . Back surgery  1998, 2000    x 2  . Nasal sinus surgery  11/2011  . Vesicovaginal fistula closure w/ tah  2007  . Anal rectal manometry N/A 10/26/2015    Procedure: ANO RECTAL MANOMETRY;  Surgeon: Mauri Pole, MD;  Location: [facility];  Service: Endoscopy;  Laterality: N/A;  . Abdominal hysterectomy  2008    laposcopic    There were no vitals filed for this visit.       Subjective Assessment - 12/16/15 1448    Subjective 1) chronic constipation started 10 years or longer. Pt had a partial hysterectomy to remove fibroids and her MD also thought the surgery would help constipation. Pt has been put on Miralax int he past but it has not been effective.  If has stopped taking Miralax and now taking Benefiber and bowel movement occur once a day. Without fiber supplements, it would take pt 3-4 days on anverage. Straining is required when not taking fiber supplements. Daily water intake: 8 (16 fl oz).  Pt eats fruits and oatmeal every morning. 2) urinary  leakage occurs with sneezing, coughing. Wears one liner/ day and one at night. 3) Low back soreness  with repreated lifting and sitting long hours 4) stress / anxiety related to work      Pertinent History Back surgeries (1998) for ruptured disc and drop foot and (2000) fusion at L3-4.  Laproscopic hysterectomy. One vaginal delivery with perineal tears    Patient Stated Goals Not to have chronic constipation            [facility] PT Assessment - 12/16/15 1308    Assessment   Medical Diagnosis Constipation   Referring Provider DeFranscesco   Observation/Other Assessments   Observations legs crossed   Other Surveys  --  PFDI 43% , ZUNG anxiety scale 34%    Coordination   Gross Motor Movements are Fluid and Coordinated --  chest breathing    Single Leg Stance   Comments 3 sec on R, 2 sec on L    Posture/Postural Control   Posture Comments lumbopelvic instability with hip flex in supine   AROM   Overall AROM Comments WNL, limited segmental mobility at lumbar segments 2/2 fusion   Palpation   SI assessment  tenderness on L PSIS    Bed Mobility   Bed Mobility --  crunch method  Pelvic Floor Special Questions - 12/16/15 1309    Pelvic Floor Internal Exam pt consented verbally without contraindications    Exam Type Vaginal   Palpation tenderness at posterior puborectalis B   increased posterior overactivity                  PT Education - 12/16/15 1348    Education provided Yes   Education Details POC, anatomy . physiology, HEP, role of stress on tight mm and nervous system   Person(s) Educated Patient   Methods Explanation;Demonstration;Tactile cues;Verbal cues;Handout   Comprehension Returned demonstration;Verbalized understanding             PT Long Term Goals - 12/16/15 1354    PT LONG TERM GOAL #1   Title Pt will demo diaphragmatic breathing in order to faciliate proper pelvic floor lengthening for improved bowel function   Time 12    Period Weeks   Status New   PT LONG TERM GOAL #2   Title Pt will demo decreased lumbar scar restrictions in order to regain spinal mobility for decreased back soreness   Time 12   Period Weeks   Status New   PT LONG TERM GOAL #3   Title Pt will demo no pelvic floor mm tightness across 2 visits in order to restore pelvic function   Time 12   Period Weeks   Status New   PT LONG TERM GOAL #4   Title Pt will report a decrease in score on her PFDI from 43% to < 30% in order to demo improved pelvic floor function and to return to ADLs and improve QOL   Time 12   Period Days   Status New   PT LONG TERM GOAL #5   Title Pt will decrease her Zung Anxiety Scale from 34  % to < 24   % in order to show IND with stress management and to relax pelvic floor mm for bowel movements.    Time 12   Period Weeks   Status New               Plan - 12/16/15 1350    Clinical Impression Statement Pt is a 55 yo female who c/o chronic constipation, urinary leakage, low back soreness, and poor stress/anxiety management. Pt's clinical presentations include scar restrictions in her lumbar area, poor coordination of deep core mm , increased pelvic floor and midback mm tightness, decreased mm strength,  ability to relax pelvic floor mm, poor balance, and poor body mechanics.   Patient will benefit from skilled therapeutic intervention in order to improve the following deficits and impairments:  Improper body mechanics, Decreased scar mobility, Decreased mobility, Decreased coordination, Decreased activity tolerance, Decreased balance, Difficulty walking, Decreased range of motion, Decreased endurance, Decreased strength, Increased muscle spasms, Postural dysfunction, Increased fascial restricitons, Decreased safety awareness    Rehab Potential Good   PT Frequency 1x / week   PT Duration 12 weeks   PT Treatment/Interventions ADLs/Self Care Home Management;Aquatic Therapy;Moist Heat;Functional mobility  training;Stair training;Therapeutic activities;Therapeutic exercise;Balance training;Neuromuscular re-education;Gait training;Manual techniques;Patient/family education;Scar mobilization;Manual lymph drainage;Energy conservation   Consulted and Agree with Plan of Care Patient      Patient will benefit from skilled therapeutic intervention in order to improve the following deficits and impairments:  Improper body mechanics, Decreased scar mobility, Decreased mobility, Decreased coordination, Decreased activity tolerance, Decreased balance, Difficulty walking, Decreased range of motion, Decreased endurance, Decreased strength, Increased muscle spasms, Postural dysfunction, Increased fascial restricitons, Decreased safety awareness  Visit Diagnosis: Muscle weakness (generalized)  Abnormal posture  Stress incontinence (female) (female)  Unspecified lack of coordination     Problem List Patient Active Problem List   Diagnosis Date Noted  . Menopause 10/20/2015  . Chronic vulvitis 10/20/2015  . SUI (stress urinary incontinence, female) 10/20/2015  . Rectocele 10/20/2015  . Cystocele 10/20/2015  . Vaginal atrophy 10/20/2015  . Chronic sinusitis 06/09/2012  . Cough 06/08/2012    Jerl Mina ,PT, DPT, E-RYT   12/16/2015, 3:16 PM  [place] MAIN [facility] SERVICES [address] [place], [place], [address] Phone: [phone]   Fax:  [phone]  Name: ZANNA ELS MRN: [id number] Date of Birth: 25-Mar-1961

## 2015-12-09 ENCOUNTER — Encounter: Payer: BLUE CROSS/BLUE SHIELD | Admitting: Physical Therapy

## 2015-12-16 ENCOUNTER — Ambulatory Visit: Payer: BLUE CROSS/BLUE SHIELD | Admitting: Physical Therapy

## 2015-12-16 DIAGNOSIS — R293 Abnormal posture: Secondary | ICD-10-CM

## 2015-12-16 DIAGNOSIS — M6281 Muscle weakness (generalized): Secondary | ICD-10-CM

## 2015-12-16 DIAGNOSIS — N393 Stress incontinence (female) (male): Secondary | ICD-10-CM

## 2015-12-16 DIAGNOSIS — R279 Unspecified lack of coordination: Secondary | ICD-10-CM

## 2015-12-16 NOTE — Addendum Note (Signed)
Addended by: Jerl Mina on: 12/16/2015 03:18 PM   Modules accepted: Orders

## 2015-12-17 NOTE — Therapy (Signed)
[place] MAIN [facility] SERVICES [address] [place], [place], [address] Phone: [phone]   Fax:  [phone]  Physical Therapy Treatment  Patient Details  Name: Lindsay Lane MRN: [id number] Date of Birth: [birth year] Referring Provider: DeFranscesco  Encounter Date: 12/16/2015      PT End of Session - 12/17/15 1635    Visit Number 2   Number of Visits 12   Date for PT Re-Evaluation 02/17/16   PT Start Time 1302   PT Stop Time 1400   PT Time Calculation (min) 58 min   Activity Tolerance Patient tolerated treatment well;No increased pain   Behavior During Therapy [facility] for tasks assessed/performed      Past Medical History  Diagnosis Date  . Seasonal allergies   . Reflux   . DDD (degenerative disc disease), lumbar   . Ruptured disk   . GERD (gastroesophageal reflux disease)   . Fibroids   . History of heavy periods     Past Surgical History  Procedure Laterality Date  . Back surgery  1998, 2000    x 2  . Nasal sinus surgery  11/2011  . Vesicovaginal fistula closure w/ tah  2007  . Anal rectal manometry N/A 10/26/2015    Procedure: ANO RECTAL MANOMETRY;  Surgeon: Mauri Pole, MD;  Location: [facility];  Service: Endoscopy;  Laterality: N/A;  . Abdominal hysterectomy  2008    laposcopic    There were no vitals filed for this visit.      Subjective Assessment - 12/17/15 1620    Subjective Pt reported she has been trying to practice the log roll technique and catching herself to set up straight. Pt has not been doing her HEP.     Pertinent History Back surgeries (1998) for ruptured disc and drop foot and (2000) fusion at L3-4.  Laproscopic hysterectomy. One vaginal delivery with perineal tears    Patient Stated Goals Not to have chronic constipation            [facility] PT Assessment - 12/17/15 1624    Coordination   Gross Motor Movements are Fluid and Coordinated --  improved diaphragmatic breathing                   Pelvic Floor Special Questions - 12/17/15 1628    Skin Integrity --  external hemorrhoid noted.    Pelvic Floor Internal Exam pt consented verbally without contraindications    Exam Type Rectal   Palpation --  delayed lengthening of posterior mm            [facility] Adult PT Treatment/Exercise - 12/17/15 1624    Therapeutic Activites    Therapeutic Activities --   Neuro Re-ed    Neuro Re-ed Details  rectal sphincter relaxation  body scan and relaxation techniques   Manual Therapy    Scar massage along lumbar scar                 PT Education - 12/17/15 1634    Education provided Yes   Education Details Relaxation techniques: body scan, relaxing anal sphincter   Person(s) Educated Patient   Methods Explanation;Demonstration;Tactile cues;Verbal cues;Handout   Comprehension Returned demonstration;Verbalized understanding             PT Long Term Goals - 12/16/15 1354    PT LONG TERM GOAL #1   Title Pt will demo diaphragmatic breathing in order to faciliate proper pelvic floor lengthening for improved bowel function  Time 12   Period Weeks   Status New   PT LONG TERM GOAL #2   Title Pt will demo decreased lumbar scar restrictions in order to regain spinal mobility for decreased back soreness   Time 12   Period Weeks   Status New   PT LONG TERM GOAL #3   Title Pt will demo no pelvic floor mm tightness across 2 visits in order to restore pelvic function   Time 12   Period Weeks   Status New   PT LONG TERM GOAL #4   Title Pt will report a decrease in score on her PFDI from 43% to < 30% in order to demo improved pelvic floor function and to return to ADLs and improve QOL   Time 12   Period Days   Status New   PT LONG TERM GOAL #5   Title Pt will decrease her Zung Anxiety Scale from 34  % to < 24   % in order to show IND with stress management and to relax pelvic floor mm for bowel movements.    Time 12   Period Weeks   Status New                Plan - 12/17/15 1635    Clinical Impression Statement Pt reported feeling more relaxed after body scan mindfulness technique and stated this exercise would be something she will do. Pt's rectal exam showed delay lengthening of the pelvic floor mm and she demo'd proper technique after tactile, visual, and verbal cuing. Pt will continue to benefit from skilled PT.    Rehab Potential Good   Clinical Impairments Affecting Rehab Potential Back surgeries (1998) for ruptured disc and drop foot and (2000) fusion at L3-4.  Laproscopic hysterectomy. One vaginal delivery with perineal tears    PT Frequency 1x / week   PT Duration 12 weeks   PT Treatment/Interventions ADLs/Self Care Home Management;Aquatic Therapy;Moist Heat;Functional mobility training;Stair training;Therapeutic activities;Therapeutic exercise;Balance training;Neuromuscular re-education;Gait training;Manual techniques;Patient/family education;Scar mobilization;Manual lymph drainage;Energy conservation   Consulted and Agree with Plan of Care Patient      Patient will benefit from skilled therapeutic intervention in order to improve the following deficits and impairments:  Improper body mechanics, Decreased scar mobility, Decreased mobility, Decreased coordination, Decreased activity tolerance, Decreased balance, Difficulty walking, Decreased range of motion, Decreased endurance, Decreased strength, Increased muscle spasms, Postural dysfunction, Increased fascial restricitons, Decreased safety awareness  Visit Diagnosis: Muscle weakness (generalized)  Abnormal posture  Unspecified lack of coordination  Stress incontinence (female) (female)     Problem List Patient Active Problem List   Diagnosis Date Noted  . Menopause 10/20/2015  . Chronic vulvitis 10/20/2015  . SUI (stress urinary incontinence, female) 10/20/2015  . Rectocele 10/20/2015  . Cystocele 10/20/2015  . Vaginal atrophy 10/20/2015  . Chronic  sinusitis 06/09/2012  . Cough 06/08/2012    Jerl Mina ,PT, DPT, E-RYT  12/17/2015, 4:53 PM  [place] MAIN [facility] SERVICES [address] [place], [place], [address] Phone: [phone]   Fax:  [phone]  Name: Lindsay Lane MRN: [id number] Date of Birth: 04-27-1961

## 2015-12-23 ENCOUNTER — Ambulatory Visit: Payer: BLUE CROSS/BLUE SHIELD | Attending: Obstetrics and Gynecology | Admitting: Physical Therapy

## 2015-12-23 DIAGNOSIS — M6281 Muscle weakness (generalized): Secondary | ICD-10-CM | POA: Insufficient documentation

## 2015-12-23 DIAGNOSIS — R279 Unspecified lack of coordination: Secondary | ICD-10-CM | POA: Insufficient documentation

## 2015-12-23 DIAGNOSIS — R293 Abnormal posture: Secondary | ICD-10-CM | POA: Insufficient documentation

## 2015-12-23 DIAGNOSIS — N393 Stress incontinence (female) (male): Secondary | ICD-10-CM | POA: Insufficient documentation

## 2015-12-24 ENCOUNTER — Ambulatory Visit: Payer: BLUE CROSS/BLUE SHIELD | Admitting: Physical Therapy

## 2015-12-24 DIAGNOSIS — R293 Abnormal posture: Secondary | ICD-10-CM

## 2015-12-24 DIAGNOSIS — M6281 Muscle weakness (generalized): Secondary | ICD-10-CM

## 2015-12-24 DIAGNOSIS — N393 Stress incontinence (female) (male): Secondary | ICD-10-CM

## 2015-12-24 DIAGNOSIS — R279 Unspecified lack of coordination: Secondary | ICD-10-CM | POA: Diagnosis present

## 2015-12-24 NOTE — Therapy (Signed)
[place] MAIN [facility] SERVICES [address] [place], [place], [address] Phone: [phone]   Fax:  [phone]  Physical Therapy Treatment  Patient Details  Name: Lindsay Lane MRN: [id number] Date of Birth: August 17, 1960 Referring Provider: DeFranscesco  Encounter Date: 12/24/2015      PT End of Session - 12/24/15 1758    Visit Number 3   Number of Visits 12   Date for PT Re-Evaluation 02/17/16   PT Start Time [id number]   PT Stop Time [id number]   PT Time Calculation (min) 40 min   Activity Tolerance Patient tolerated treatment well;No increased pain   Behavior During Therapy [facility] for tasks assessed/performed      Past Medical History  Diagnosis Date  . Seasonal allergies   . Reflux   . DDD (degenerative disc disease), lumbar   . Ruptured disk   . GERD (gastroesophageal reflux disease)   . Fibroids   . History of heavy periods     Past Surgical History  Procedure Laterality Date  . Back surgery  1998, 2000    x 2  . Nasal sinus surgery  11/2011  . Vesicovaginal fistula closure w/ tah  2007  . Anal rectal manometry N/A 10/26/2015    Procedure: ANO RECTAL MANOMETRY;  Surgeon: Mauri Pole, MD;  Location: [facility];  Service: Endoscopy;  Laterality: N/A;  . Abdominal hysterectomy  2008    laposcopic    There were no vitals filed for this visit.      Subjective Assessment - 12/24/15 1715    Subjective Pt reported she is trying to be conscientious of posture, using a step stool with toileting, and sleeping with pillow between knees. Since her last visit, pt has had regular bowel movements daily and Type 4 consistency while taking Benefiber.    Pertinent History Back surgeries (1998) for ruptured disc and drop foot and (2000) fusion at L3-4.  Laproscopic hysterectomy. One vaginal delivery with perineal tears    Patient Stated Goals Not to have chronic constipation                      Pelvic Floor Special Questions -  12/24/15 1744    Pelvic Floor Internal Exam pt consented verbally without contraindications    Exam Type Vaginal   Palpation tenderness at perineal scar L > R at introitus           [facility] Adult PT Treatment/Exercise - 12/24/15 1744    Neuro Re-ed    Neuro Re-ed Details  see pt instructions, minor cues for low abdominal and pelvic floor lengthening with inhalation   Manual Therapy   Internal Pelvic Floor perineal scar very light myofascial release intravaginally                 PT Education - 12/24/15 1757    Education provided Yes   Education Details HEP, pelvic floor training coordination with coughing, sneezing, laughing, and deep core level2   Person(s) Educated Patient   Methods Explanation;Demonstration;Tactile cues;Verbal cues;Handout   Comprehension Returned demonstration;Verbalized understanding;Verbal cues required;Tactile cues required             PT Long Term Goals - 12/24/15 1718    PT LONG TERM GOAL #1   Title Pt will demo diaphragmatic breathing in order to faciliate proper pelvic floor lengthening for improved bowel function   Time 12   Period Weeks   Status Achieved   PT LONG TERM GOAL #2  Title Pt will demo decreased lumbar scar restrictions in order to regain spinal mobility for decreased back soreness   Time 12   Period Weeks   Status New   PT LONG TERM GOAL #3   Title Pt will demo no pelvic floor mm tightness across 2 visits in order to restore pelvic function   Time 12   Period Weeks   Status New   PT LONG TERM GOAL #4   Title Pt will report a decrease in score on her PFDI from 43% to < 30% in order to demo improved pelvic floor function and to return to ADLs and improve QOL   Time 12   Period Days   Status New   PT LONG TERM GOAL #5   Title Pt will decrease her Zung Anxiety Scale from 34  % to < 24   % in order to show IND with stress management and to relax pelvic floor mm for bowel movements.    Time 12   Period Weeks   Status  New               Plan - 12/24/15 1758    Clinical Impression Statement Pt reported having improved bowel movements and stool consistency. Without straining. She demo'd good carry over with diaphragmatic and pelvic floor coordination. Post-Tx, pt demo'd less tenderness and tensions along perineal scar L > R and was able to elicit a more circumferential contraction with less cuing. Pt was educated on how to contract pelvic floor mm with coughing, sneezing, and laughing which she demo'd correctly. Pt progressed to deep core level 2 without difficulty. Pt will continue to benefit from skilled PT.    Rehab Potential Good   Clinical Impairments Affecting Rehab Potential Back surgeries (1998) for ruptured disc and drop foot and (2000) fusion at L3-4.  Laproscopic hysterectomy. One vaginal delivery with perineal tears    PT Frequency 1x / week   PT Duration 12 weeks   PT Treatment/Interventions ADLs/Self Care Home Management;Aquatic Therapy;Moist Heat;Functional mobility training;Stair training;Therapeutic activities;Therapeutic exercise;Balance training;Neuromuscular re-education;Gait training;Manual techniques;Patient/family education;Scar mobilization;Manual lymph drainage;Energy conservation   Consulted and Agree with Plan of Care Patient      Patient will benefit from skilled therapeutic intervention in order to improve the following deficits and impairments:  Improper body mechanics, Decreased scar mobility, Decreased mobility, Decreased coordination, Decreased activity tolerance, Decreased balance, Difficulty walking, Decreased range of motion, Decreased endurance, Decreased strength, Increased muscle spasms, Postural dysfunction, Increased fascial restricitons, Decreased safety awareness  Visit Diagnosis: Muscle weakness (generalized)  Abnormal posture  Unspecified lack of coordination  Stress incontinence (female) (female)     Problem List Patient Active Problem List   Diagnosis  Date Noted  . Menopause 10/20/2015  . Chronic vulvitis 10/20/2015  . SUI (stress urinary incontinence, female) 10/20/2015  . Rectocele 10/20/2015  . Cystocele 10/20/2015  . Vaginal atrophy 10/20/2015  . Chronic sinusitis 06/09/2012  . Cough 06/08/2012    Jerl Mina ,PT, DPT, E-RYT  12/24/2015, 6:02 PM  [place] MAIN [facility] SERVICES [address] [place], [place], [address] Phone: [phone]   Fax:  [phone]  Name: Lindsay Lane MRN: [id number] Date of Birth: 1960-10-16

## 2015-12-24 NOTE — Patient Instructions (Addendum)
PELVIC FLOOR / KEGEL EXERCISES   Pelvic floor/ Kegel exercises are used to strengthen the muscles in the base of your pelvis that are responsible for supporting your pelvic organs and preventing urine/feces leakage. Based on your therapist's recommendations, they can be performed while standing, sitting, or lying down. Imagine pelvic floor area as a diamond with pelvic landmarks: top =pubic bone, bottom tip=tailbone, sides=sitting bones (ischial tuberosities).    Make yourself aware of this muscle group by using these cues while coordinating your breath:  Inhale, feel pelvic floor diamond area lower like hammock towards your feet and ribcage/belly expanding. Pause. Let the exhale naturally and feel your belly sink, abdominal muscles hugging in around you and you may notice the pelvic diamond draws upward towards your head forming a umbrella shape. Give a squeeze during the exhalation like you are stopping the flow of urine. If you are squeezing the buttock muscles, try to give 50% less effort.   Common Errors:  Breath holding: If you are holding your breath, you may be bearing down against your bladder instead of pulling it up. If you belly bulges up while you are squeezing, you are holding your breath. Be sure to breathe gently in and out while exercising. Counting out loud may help you avoid holding your breath.  Accessory muscle use: You should not see or feel other muscle movement when performing pelvic floor exercises. When done properly, no one can tell that you are performing the exercises. Keep the buttocks, belly and inner thighs relaxed.  Overdoing it: Your muscles can fatigue and stop working for you if you over-exercise. You may actually leak more or feel soreness at the lower abdomen or rectum.  YOUR HOME EXERCISE PROGRAM                      DECREASE DOWNWARD PRESSURE ON  YOUR PELVIC FLOOR, ABDOMINAL, LOW BACK MUSCLES       PRESERVE YOUR PELVIC HEALTH LONG-TERM   **  SQUEEZE pelvic floor BEFORE YOUR SNEEZE, COUGH, LAUGH   ** EXHALE BEFORE YOU RISE AGAINST GRAVITY (lifting, sit to stand, from squat to stand)   ** LOG ROLL OUT OF BED INSTEAD OF CRUNCH/SIT-UP   **ALSO SQUEEZE BEFORE YOUR SNEEZE, COUGH, LAUGH to decrease downward pressure   **ALSO EXHALE BEFORE YOU RISE AGAINST GRAVITY (lifting, sit to stand, from squat to stand)    ** Begin deep core level 2 (30 reps in the morning and at night)

## 2016-01-08 ENCOUNTER — Ambulatory Visit: Payer: BLUE CROSS/BLUE SHIELD | Admitting: Physical Therapy

## 2016-01-08 DIAGNOSIS — R279 Unspecified lack of coordination: Secondary | ICD-10-CM

## 2016-01-08 DIAGNOSIS — M6281 Muscle weakness (generalized): Secondary | ICD-10-CM

## 2016-01-08 DIAGNOSIS — N393 Stress incontinence (female) (male): Secondary | ICD-10-CM

## 2016-01-08 DIAGNOSIS — R293 Abnormal posture: Secondary | ICD-10-CM

## 2016-01-08 NOTE — Patient Instructions (Signed)
Pelvic tilt in seated, hooklying

## 2016-01-08 NOTE — Therapy (Addendum)
[place] MAIN [facility] SERVICES [address] [place], [place], [address] Phone: [phone]   Fax:  [phone]  Physical Therapy Treatment  Patient Details  Name: Lindsay Lane MRN: [id number] Date of Birth: 03-18-1961 Referring Provider: DeFranscesco  Encounter Date: 01/08/2016      PT End of Session - 01/08/16 1316    Visit Number 4   Number of Visits 12   Date for PT Re-Evaluation 02/17/16   PT Start Time 2563   PT Stop Time 1400   PT Time Calculation (min) 55 min   Activity Tolerance Patient tolerated treatment well;No increased pain   Behavior During Therapy [facility] for tasks assessed/performed      Past Medical History  Diagnosis Date  . Seasonal allergies   . Reflux   . DDD (degenerative disc disease), lumbar   . Ruptured disk   . GERD (gastroesophageal reflux disease)   . Fibroids   . History of heavy periods     Past Surgical History  Procedure Laterality Date  . Back surgery  1998, 2000    x 2  . Nasal sinus surgery  11/2011  . Vesicovaginal fistula closure w/ tah  2007  . Anal rectal manometry N/A 10/26/2015    Procedure: ANO RECTAL MANOMETRY;  Surgeon: Mauri Pole, MD;  Location: [facility];  Service: Endoscopy;  Laterality: N/A;  . Abdominal hysterectomy  2008    laposcopic    There were no vitals filed for this visit.      Subjective Assessment - 01/08/16 1307    Subjective (p) Pt reported she did her HEP close to everyday with regular bowel movements. This week pt has not been as consistent with HEP due stress has not been eating as much fruit and bowel movements have not been as regular. Pt also reports her back  has been 5/10 tenderness at night this week, currently 3/10    Pertinent History Back surgeries (1998) for ruptured disc and drop foot and (2000) fusion at L3-4.  Laproscopic hysterectomy. One vaginal delivery with perineal tears    Patient Stated Goals Not to have chronic constipation             [facility] PT Assessment - 01/08/16 2327    Palpation   Palpation comment significant fascial restrictions over lumbar scar and L coccygeus, lateral edge of sacrum. Hypomobility at T7-10              [facility] PT Treatment - 01/08/16 2327    Manual Therapy    Fascial  releases at significant fascial restrictions over lumbar scar and L coccygeus, lateral edge of sacrum.  PA mobs Grade II-III at [facility] of T7-10.                        PT Education - 01/08/16 2329    Education provided Yes   Education Details HEP   Person(s) Educated Patient   Methods Explanation;Demonstration;Tactile cues;Verbal cues;Handout   Comprehension Returned demonstration;Verbalized understanding             PT Long Term Goals - 01/08/16 2333    PT LONG TERM GOAL #1   Title Pt will demo diaphragmatic breathing in order to faciliate proper pelvic floor lengthening for improved bowel function   Time 12   Period Weeks   Status Achieved   PT LONG TERM GOAL #2   Title Pt will demo decreased lumbar scar restrictions in order to regain spinal  mobility for decreased back soreness   Time 12   Period Weeks   Status Partially Met   PT LONG TERM GOAL #3   Title Pt will demo no pelvic floor mm tightness across 2 visits in order to restore pelvic function   Time 12   Period Weeks   Status On-going   PT LONG TERM GOAL #4   Title Pt will report a decrease in score on her PFDI from 43% to < 30% in order to demo improved pelvic floor function and to return to ADLs and improve QOL   Time 12   Period Days   Status On-going   PT LONG TERM GOAL #5   Title Pt will decrease her Zung Anxiety Scale from 34  % to < 24   % in order to show IND with stress management and to relax pelvic floor mm for bowel movements.    Time 12   Period Weeks   Status On-going               Plan - 01/08/16 2330    Clinical Impression Statement Pt showed significantly decreased fascial restrictions over her  lumbar scar and L coccygeus mm tensions following manual Tx.  Pt reported 90% improvement with low back tenderness at the end of session.     Rehab Potential Good   Clinical Impairments Affecting Rehab Potential Back surgeries (1998) for ruptured disc and drop foot and (2000) fusion at L3-4.  Laproscopic hysterectomy. One vaginal delivery with perineal tears    PT Frequency 1x / week   PT Duration 12 weeks   PT Treatment/Interventions ADLs/Self Care Home Management;Aquatic Therapy;Moist Heat;Functional mobility training;Stair training;Therapeutic activities;Therapeutic exercise;Balance training;Neuromuscular re-education;Gait training;Manual techniques;Patient/family education;Scar mobilization;Manual lymph drainage;Energy conservation   Consulted and Agree with Plan of Care Patient      Patient will benefit from skilled therapeutic intervention in order to improve the following deficits and impairments:  Improper body mechanics, Decreased scar mobility, Decreased mobility, Decreased coordination, Decreased activity tolerance, Decreased balance, Difficulty walking, Decreased range of motion, Decreased endurance, Decreased strength, Increased muscle spasms, Postural dysfunction, Increased fascial restricitons, Decreased safety awareness  Visit Diagnosis: Muscle weakness (generalized)  Abnormal posture  Unspecified lack of coordination  Stress incontinence (female) (female)     Problem List Patient Active Problem List   Diagnosis Date Noted  . Menopause 10/20/2015  . Chronic vulvitis 10/20/2015  . SUI (stress urinary incontinence, female) 10/20/2015  . Rectocele 10/20/2015  . Cystocele 10/20/2015  . Vaginal atrophy 10/20/2015  . Chronic sinusitis 06/09/2012  . Cough 06/08/2012    Jerl Mina ,PT, DPT, E-RYT  01/08/2016, 11:41 PM  [place] MAIN [facility] SERVICES [address] [place], [place], [address] Phone: [phone]   Fax:   [phone]  Name: Lindsay Lane MRN: [id number] Date of Birth: 03-13-1961

## 2016-01-19 ENCOUNTER — Encounter: Payer: BLUE CROSS/BLUE SHIELD | Admitting: Physical Therapy

## 2016-02-02 ENCOUNTER — Ambulatory Visit: Payer: BLUE CROSS/BLUE SHIELD | Attending: Obstetrics and Gynecology | Admitting: Physical Therapy

## 2016-02-02 DIAGNOSIS — R293 Abnormal posture: Secondary | ICD-10-CM | POA: Diagnosis present

## 2016-02-02 DIAGNOSIS — M6281 Muscle weakness (generalized): Secondary | ICD-10-CM | POA: Diagnosis not present

## 2016-02-02 DIAGNOSIS — R279 Unspecified lack of coordination: Secondary | ICD-10-CM | POA: Diagnosis present

## 2016-02-02 DIAGNOSIS — N393 Stress incontinence (female) (male): Secondary | ICD-10-CM | POA: Insufficient documentation

## 2016-02-02 NOTE — Patient Instructions (Signed)
PELVIC FLOOR / KEGEL EXERCISES   Pelvic floor/ Kegel exercises are used to strengthen the muscles in the base of your pelvis that are responsible for supporting your pelvic organs and preventing urine/feces leakage. Based on your therapist's recommendations, they can be performed while standing, sitting, or lying down. Imagine pelvic floor area as a diamond with pelvic landmarks: top =pubic bone, bottom tip=tailbone, sides=sitting bones (ischial tuberosities).    Make yourself aware of this muscle group by using these cues while coordinating your breath:  Inhale, feel pelvic floor diamond area lower like hammock towards your feet and ribcage/belly expanding. Pause. Let the exhale naturally and feel your belly sink, abdominal muscles hugging in around you and you may notice the pelvic diamond draws upward towards your head forming a umbrella shape. Give a squeeze during the exhalation like you are stopping the flow of urine. If you are squeezing the buttock muscles, try to give 50% less effort.   Common Errors:  Breath holding: If you are holding your breath, you may be bearing down against your bladder instead of pulling it up. If you belly bulges up while you are squeezing, you are holding your breath. Be sure to breathe gently in and out while exercising. Counting out loud may help you avoid holding your breath.  Accessory muscle use: You should not see or feel other muscle movement when performing pelvic floor exercises. When done properly, no one can tell that you are performing the exercises. Keep the buttocks, belly and inner thighs relaxed.  Overdoing it: Your muscles can fatigue and stop working for you if you over-exercise. You may actually leak more or feel soreness at the lower abdomen or rectum.  YOUR HOME EXERCISE PROGRAM    SHORT HOLDS: Position: on back, sitting   Inhale and then exhale. Then squeeze the muscle.  (Be sure to let belly sink in with exhales and not push  outward)  Perform 5 repetitions, 5  Times/day                  DECREASE DOWNWARD PRESSURE ON  YOUR PELVIC FLOOR, ABDOMINAL, LOW BACK MUSCLES       PRESERVE YOUR PELVIC HEALTH LONG-TERM   ** SQUEEZE pelvic floor BEFORE YOUR SNEEZE, COUGH, LAUGH   ** EXHALE BEFORE YOU RISE AGAINST GRAVITY (lifting, sit to stand, from squat to stand)   ** LOG ROLL OUT OF BED INSTEAD OF CRUNCH/SIT-UP   -----------  You are now ready to begin training the deep core muscles system: diaphragm, transverse abdominis, pelvic floor . These muscles must work together as a team.           The key to these exercises to train the brain to coordinate the timing of these muscles and to have them turn on for long periods of time to hold you upright against gravity (especially important if you are on your feet all day).These muscles are postural muscles and play a role stabilizing your spine and bodyweight. By doing these repetitions slowly and correctly instead of doing crunches, you will achieve a flatter belly without a lower pooch. You are also placing your spine in a more neutral position and breathing properly which in turn, decreases your risk for problems related to your pelvic floor, abdominal, and low back such as pelvic organ prolapse, hernias, diastasis recti (separation of superficial muscles), disk herniations, spinal fractures. These exercises set a solid foundation for you to later progress to resistance/ strength training with therabands and weights and return  to other typical fitness exercises with a stronger deeper core.  Warm up with Level 1 (10 reps, slow and no straining with abdomen)   Level 2 (30 reps in the morning, and 30 reps in the evening)  To strengthen deep core.     Nutritional books: Healing Pain Now By Johnson and Healing by Fredderick Erb  Staying healthy with the Seasonas  By Sampson Goon MD

## 2016-02-03 NOTE — Therapy (Signed)
[place] MAIN [facility] SERVICES [address] [place], [place], [address] Phone: [phone]   Fax:  [phone]  Physical Therapy Treatment /Discharge Summary  Patient Details  Name: Lindsay Lane MRN: [id number] Date of Birth: 12-21-1960 Referring Provider: DeFranscesco  Encounter Date: 02/02/2016      PT End of Session - 02/03/16 2129    Visit Number 5   Number of Visits 12   Date for PT Re-Evaluation 02/17/16   PT Start Time 5465   PT Stop Time 1430   PT Time Calculation (min) 55 min   Activity Tolerance Patient tolerated treatment well;No increased pain   Behavior During Therapy WFL for tasks assessed/performed      Past Medical History:  Diagnosis Date  . DDD (degenerative disc disease), lumbar   . Fibroids   . GERD (gastroesophageal reflux disease)   . History of heavy periods   . Reflux   . Ruptured disk   . Seasonal allergies     Past Surgical History:  Procedure Laterality Date  . ABDOMINAL HYSTERECTOMY  2008   laposcopic  . ANAL RECTAL MANOMETRY N/A 10/26/2015   Procedure: ANO RECTAL MANOMETRY;  Surgeon: Mauri Pole, MD;  Location: [facility];  Service: Endoscopy;  Laterality: N/A;  . [place], 2000   x 2  . NASAL SINUS SURGERY  11/2011  . VESICOVAGINAL FISTULA CLOSURE W/ TAH  2007    There were no vitals filed for this visit.          [facility] PT Assessment - 02/03/16 2126      Coordination   Fine Motor Movements are Fluid and Coordinated --  minor cues to not strain abdomen with pelvic floor quick lif     Palpation   Palpation comment slightly restrictions at lumbar scar                  Pelvic Floor Special Questions - 02/03/16 2127    Pelvic Floor Internal Exam pt consented verbally without contraindications    Exam Type Vaginal   Palpation slight tenderness at L iliococcgueus. No tensions noted B    Strength fair squeeze, definite lift  proper coordination            [facility] Adult PT Treatment/Exercise - 02/02/16 1427      Neuro Re-ed    Neuro Re-ed Details  see pt instructions     Manual Therapy   Internal Pelvic Floor lumbar scar release, pelvic floor reassessment                PT Education - 02/02/16 1430    Education provided Yes   Education Details HEP, d/c   Person(s) Educated Patient   Methods Explanation;Demonstration;Tactile cues;Verbal cues;Handout   Comprehension Returned demonstration;Verbalized understanding             PT Long Term Goals - 02/02/16 1340      PT LONG TERM GOAL #1   Title Pt will demo diaphragmatic breathing in order to faciliate proper pelvic floor lengthening for improved bowel function   Time 12   Status Achieved     PT LONG TERM GOAL #2   Title Pt will demo decreased lumbar scar restrictions in order to regain spinal mobility for decreased back soreness   Time 12   Period Weeks   Status Partially Met     PT LONG TERM GOAL #3   Title Pt will demo no pelvic floor mm tightness across  2 visits in order to restore pelvic function   Time 12   Period Weeks   Status Achieved     PT LONG TERM GOAL #4   Title Pt will report a decrease in score on her PFDI from 43% to < 30% in order to demo improved pelvic floor function and to return to ADLs and improve QOL .  (02/02/16: 40%)    Time 12   Period Weeks   Status Partially Met     PT LONG TERM GOAL #5   Title Pt will decrease her Zung Anxiety Scale from 34  % to < 24   % in order to show IND with stress management and to relax pelvic floor mm for bowel movements.    Time 12   Period Weeks   Status Unable to assess               Plan - 02/03/16 2129    Clinical Impression Statement Pt reported she experienced a "very great better" based on GROC and has achieved 50% of her goals, while partially meeting her remaining goals.  She reported she had regular bowel movements ever since her 2nd visit, however, she had a recent relapse due life  stressors. Despite her relapse today (5th visit), pt showed significantly decreased pelvic floor mm tensions/tenderness and improved pelvic floor ROM/coordination without abdominal/pelvic floor straining.  Pt was educated today on how to coordination pelvic floor activation with coughing to minimize SUI and pt demo'd proper technique. Withheld pelvic floor endurance training due to pt's prior overactive pelvic floor presentation. Pt was shown how to decrease lumbar scar restrictions to optimize lumbopelvic health. Pt is self-discharging at this time as she is dealing with the loss of a family member.  Thank you for your referral!    Rehab Potential Good   Clinical Impairments Affecting Rehab Potential Back surgeries (1998) for ruptured disc and drop foot and (2000) fusion at L3-4.  Laproscopic hysterectomy. One vaginal delivery with perineal tears    PT Frequency 1x / week   PT Duration 12 weeks   PT Treatment/Interventions ADLs/Self Care Home Management;Aquatic Therapy;Moist Heat;Functional mobility training;Stair training;Therapeutic activities;Therapeutic exercise;Balance training;Neuromuscular re-education;Gait training;Manual techniques;Patient/family education;Scar mobilization;Manual lymph drainage;Energy conservation   Consulted and Agree with Plan of Care Patient      Patient will benefit from skilled therapeutic intervention in order to improve the following deficits and impairments:  Improper body mechanics, Decreased scar mobility, Decreased mobility, Decreased coordination, Decreased activity tolerance, Decreased balance, Difficulty walking, Decreased range of motion, Decreased endurance, Decreased strength, Increased muscle spasms, Postural dysfunction, Increased fascial restricitons, Decreased safety awareness  Visit Diagnosis: Muscle weakness (generalized)  Abnormal posture  Unspecified lack of coordination  Stress incontinence (female) (female)     Problem List Patient Active  Problem List   Diagnosis Date Noted  . Menopause 10/20/2015  . Chronic vulvitis 10/20/2015  . SUI (stress urinary incontinence, female) 10/20/2015  . Rectocele 10/20/2015  . Cystocele 10/20/2015  . Vaginal atrophy 10/20/2015  . Chronic sinusitis 06/09/2012  . Cough 06/08/2012    Jerl Mina ,PT, DPT, E-RYT  02/03/2016, 9:57 PM  [place] MAIN [facility] SERVICES [address] [place], [place], [address] Phone: [phone]   Fax:  [phone]  Name: Lindsay Lane MRN: [id number] Date of Birth: 1960-09-26

## 2016-02-15 ENCOUNTER — Encounter: Payer: BLUE CROSS/BLUE SHIELD | Admitting: Physical Therapy

## 2016-02-23 ENCOUNTER — Other Ambulatory Visit: Payer: Self-pay | Admitting: Obstetrics and Gynecology

## 2016-07-13 ENCOUNTER — Encounter: Payer: Self-pay | Admitting: Family Medicine

## 2016-07-13 ENCOUNTER — Ambulatory Visit (INDEPENDENT_AMBULATORY_CARE_PROVIDER_SITE_OTHER): Payer: BC Managed Care – PPO | Admitting: Family Medicine

## 2016-07-13 VITALS — BP 119/80 | HR 94 | Temp 101.0°F | Wt 149.0 lb

## 2016-07-13 DIAGNOSIS — R350 Frequency of micturition: Secondary | ICD-10-CM

## 2016-07-13 DIAGNOSIS — J101 Influenza due to other identified influenza virus with other respiratory manifestations: Secondary | ICD-10-CM

## 2016-07-13 LAB — UA/M W/RFLX CULTURE, ROUTINE
BILIRUBIN UA: NEGATIVE
GLUCOSE, UA: NEGATIVE
Ketones, UA: NEGATIVE
LEUKOCYTES UA: NEGATIVE
Nitrite, UA: NEGATIVE
PH UA: 6.5 (ref 5.0–7.5)
PROTEIN UA: NEGATIVE
RBC, UA: NEGATIVE
Specific Gravity, UA: 1.01 (ref 1.005–1.030)
Urobilinogen, Ur: 0.2 mg/dL (ref 0.2–1.0)

## 2016-07-13 LAB — VERITOR FLU A/B WAIVED
INFLUENZA A: POSITIVE — AB
Influenza B: NEGATIVE

## 2016-07-13 MED ORDER — OSELTAMIVIR PHOSPHATE 75 MG PO CAPS: 75.0000 mg | ORAL_CAPSULE | Freq: Two times a day (BID) | ORAL | 0 refills | Status: DC

## 2016-07-13 MED ORDER — BENZONATATE 100 MG PO CAPS: 200.0000 mg | ORAL_CAPSULE | Freq: Three times a day (TID) | ORAL | 0 refills | Status: DC | PRN

## 2016-07-13 MED ORDER — HYDROCOD POLST-CPM POLST ER 10-8 MG/5ML PO SUER: 5.0000 mL | Freq: Two times a day (BID) | ORAL | 0 refills | Status: DC | PRN

## 2016-07-13 MED ORDER — PREDNISONE 20 MG PO TABS: 40.0000 mg | ORAL_TABLET | Freq: Every day | ORAL | 0 refills | Status: DC

## 2016-07-13 NOTE — Progress Notes (Signed)
BP 119/80   Pulse 94   Temp (!) 101 F (38.3 C)   Wt 149 lb (67.6 kg)   SpO2 96%   BMI 26.39 kg/m    Subjective:    Patient ID: Lindsay Lane, female    DOB: 09-10-1960, 56 y.o.   MRN: [id number]  HPI: Lindsay Lane is a 56 y.o. female  Chief Complaint  Patient presents with  . URI    x 1 day, fever, body aches, productive cough, sob, head/chest congestion, sore throat, ear pressure.   . Urinary Frequency    lower abdomen pressure and urinary frequency   Patient presents with 1 day history of fever, body aches, productive cough, congestion, sore throat, ear pressure, and significant DOE. States she can't catch her breath any time she walks around. Inhalers not improving breathlessness. Denies wheezing, CP. Trying advil and OTC cold medicines with no relief. Unsure about sick contacts.         Past Medical History:  Diagnosis Date  . DDD (degenerative disc disease), lumbar   . Fibroids   . GERD (gastroesophageal reflux disease)   . History of heavy periods   . Reflux   . Ruptured disk   . Seasonal allergies    Social History   Social History  . Marital status: Single    Spouse name: N/A  . Number of children: N/A  . Years of education: N/A   Occupational History  . Glass blower/designer [employer]   Social History Main Topics  . Smoking status: Former Smoker    Packs/day: 0.30    Years: 20.00    Types: Cigarettes    Quit date: 03/21/2011  . Smokeless tobacco: Never Used  . Alcohol use Yes     Comment: occas  . Drug use: No  . Sexual activity: Not Currently    Birth control/ protection: Surgical   Other Topics Concern  . Not on file   Social History Narrative  . No narrative on file    Relevant past medical, surgical, family and social history reviewed and updated as indicated. Interim medical history since our last visit reviewed. Allergies and medications reviewed and updated.  Review of Systems  Constitutional: Positive for chills, diaphoresis,  fatigue and fever.  HENT: Positive for congestion, ear pain and sore throat.   Eyes: Negative.   Respiratory: Positive for cough, chest tightness and shortness of breath.   Cardiovascular: Negative.   Gastrointestinal: Negative.   Genitourinary: Negative.   Musculoskeletal: Positive for myalgias.  Neurological: Negative.   Psychiatric/Behavioral: Negative.     Per HPI unless specifically indicated above     Objective:    BP 119/80   Pulse 94   Temp (!) 101 F (38.3 C)   Wt 149 lb (67.6 kg)   SpO2 96%   BMI 26.39 kg/m   Wt Readings from Last 3 Encounters:  07/13/16 149 lb (67.6 kg)  11/17/15 144 lb 8 oz (65.5 kg)  11/10/15 148 lb 4.8 oz (67.3 kg)    Physical Exam  Constitutional: She is oriented to person, place, and time. She appears well-developed and well-nourished.  HENT:  Head: Atraumatic.  Oropharynx erythematous B/l middle ears with mild effusion  Eyes: Conjunctivae are normal. Pupils are equal, round, and reactive to light.  Neck: Normal range of motion. Neck supple.  Cardiovascular: Normal rate and normal heart sounds.   Pulmonary/Chest: Effort normal and breath sounds normal. No respiratory distress. She has no wheezes. She has no rales.  Musculoskeletal: Normal range of motion.  Neurological: She is alert and oriented to person, place, and time.  Skin: Skin is warm and dry.  Psychiatric: She has a normal mood and affect. Her behavior is normal.  Nursing note and vitals reviewed.     Assessment & Plan:   Problem List Items Addressed This Visit    None    Visit Diagnoses    Influenza A    -  Primary   Tamiflu, prednisone, and tussionex sent, supportive care discussed. Precautions discussed with medications. Follow up if no improvement.   Relevant Medications   oseltamivir (TAMIFLU) 75 MG capsule   Other Relevant Orders   Influenza A & B (STAT)   Urinary frequency       U/A negative for UTI. Push fluids, if still symptomatic once recovered from flu  will test another urine specimen   Relevant Orders   UA/M w/rflx Culture, Routine (STAT)       Follow up plan: Return if symptoms worsen or fail to improve.

## 2016-07-13 NOTE — Patient Instructions (Signed)
Follow up as needed

## 2016-10-20 ENCOUNTER — Encounter: Payer: BLUE CROSS/BLUE SHIELD | Admitting: Obstetrics and Gynecology

## 2016-11-03 ENCOUNTER — Ambulatory Visit (INDEPENDENT_AMBULATORY_CARE_PROVIDER_SITE_OTHER): Payer: BC Managed Care – PPO | Admitting: Obstetrics and Gynecology

## 2016-11-03 ENCOUNTER — Encounter: Payer: Self-pay | Admitting: Obstetrics and Gynecology

## 2016-11-03 VITALS — BP 135/81 | HR 62 | Ht 63.0 in | Wt 148.2 lb

## 2016-11-03 DIAGNOSIS — Z780 Asymptomatic menopausal state: Secondary | ICD-10-CM | POA: Diagnosis not present

## 2016-11-03 DIAGNOSIS — Z1231 Encounter for screening mammogram for malignant neoplasm of breast: Secondary | ICD-10-CM | POA: Diagnosis not present

## 2016-11-03 DIAGNOSIS — Z90711 Acquired absence of uterus with remaining cervical stump: Secondary | ICD-10-CM

## 2016-11-03 DIAGNOSIS — Z01419 Encounter for gynecological examination (general) (routine) without abnormal findings: Secondary | ICD-10-CM | POA: Diagnosis not present

## 2016-11-03 DIAGNOSIS — N952 Postmenopausal atrophic vaginitis: Secondary | ICD-10-CM

## 2016-11-03 DIAGNOSIS — Z1211 Encounter for screening for malignant neoplasm of colon: Secondary | ICD-10-CM | POA: Diagnosis not present

## 2016-11-03 DIAGNOSIS — K5909 Other constipation: Secondary | ICD-10-CM | POA: Insufficient documentation

## 2016-11-03 MED ORDER — PRASTERONE 6.5 MG VA INST: 6.5000 mg | VAGINAL_INSERT | Freq: Every day | VAGINAL | 6 refills | Status: DC

## 2016-11-03 NOTE — Patient Instructions (Signed)
1. No Pap smear done. Next Pap is due in 2020 2. Mammogram is ordered 3. Stool guaiac cards are given for colon cancer screening 4. Screening labs are obtained today 5. Discontinue Premarin cream intravaginal for vaginal atrophy 6. Start Intrarosa vaginal suppositories nightly for vaginal atrophy 7. Continue with healthy eating and exercise 8. Continue with calcium and vitamin D supplementation daily 9. Return in 1 year for annual exam   Health Maintenance for Postmenopausal Women Menopause is a normal process in which your reproductive ability comes to an end. This process happens gradually over a span of months to years, usually between the ages of 3 and 18. Menopause is complete when you have missed 12 consecutive menstrual periods. It is important to talk with your health care provider about some of the most common conditions that affect postmenopausal women, such as heart disease, cancer, and bone loss (osteoporosis). Adopting a healthy lifestyle and getting preventive care can help to promote your health and wellness. Those actions can also lower your chances of developing some of these common conditions. What should I know about menopause? During menopause, you may experience a number of symptoms, such as:  Moderate-to-severe hot flashes.  Night sweats.  Decrease in sex drive.  Mood swings.  Headaches.  Tiredness.  Irritability.  Memory problems.  Insomnia. Choosing to treat or not to treat menopausal changes is an individual decision that you make with your health care provider. What should I know about hormone replacement therapy and supplements? Hormone therapy products are effective for treating symptoms that are associated with menopause, such as hot flashes and night sweats. Hormone replacement carries certain risks, especially as you become older. If you are thinking about using estrogen or estrogen with progestin treatments, discuss the benefits and risks with your  health care provider. What should I know about heart disease and stroke? Heart disease, heart attack, and stroke become more likely as you age. This may be due, in part, to the hormonal changes that your body experiences during menopause. These can affect how your body processes dietary fats, triglycerides, and cholesterol. Heart attack and stroke are both medical emergencies. There are many things that you can do to help prevent heart disease and stroke:  Have your blood pressure checked at least every 1-2 years. High blood pressure causes heart disease and increases the risk of stroke.  If you are 64-25 years old, ask your health care provider if you should take aspirin to prevent a heart attack or a stroke.  Do not use any tobacco products, including cigarettes, chewing tobacco, or electronic cigarettes. If you need help quitting, ask your health care provider.  It is important to eat a healthy diet and maintain a healthy weight.  Be sure to include plenty of vegetables, fruits, low-fat dairy products, and lean protein.  Avoid eating foods that are high in solid fats, added sugars, or salt (sodium).  Get regular exercise. This is one of the most important things that you can do for your health.  Try to exercise for at least 150 minutes each week. The type of exercise that you do should increase your heart rate and make you sweat. This is known as moderate-intensity exercise.  Try to do strengthening exercises at least twice each week. Do these in addition to the moderate-intensity exercise.  Know your numbers.Ask your health care provider to check your cholesterol and your blood glucose. Continue to have your blood tested as directed by your health care provider. What should  I know about cancer screening? There are several types of cancer. Take the following steps to reduce your risk and to catch any cancer development as early as possible. Breast Cancer  Practice breast  self-awareness.  This means understanding how your breasts normally appear and feel.  It also means doing regular breast self-exams. Let your health care provider know about any changes, no matter how small.  If you are 40 or older, have a clinician do a breast exam (clinical breast exam or CBE) every year. Depending on your age, family history, and medical history, it may be recommended that you also have a yearly breast X-ray (mammogram).  If you have a family history of breast cancer, talk with your health care provider about genetic screening.  If you are at high risk for breast cancer, talk with your health care provider about having an MRI and a mammogram every year.  Breast cancer (BRCA) gene test is recommended for women who have family members with BRCA-related cancers. Results of the assessment will determine the need for genetic counseling and BRCA1 and for BRCA2 testing. BRCA-related cancers include these types:  Breast. This occurs in males or females.  Ovarian.  Tubal. This may also be called fallopian tube cancer.  Cancer of the abdominal or pelvic lining (peritoneal cancer).  Prostate.  Pancreatic. Cervical, Uterine, and Ovarian Cancer  Your health care provider may recommend that you be screened regularly for cancer of the pelvic organs. These include your ovaries, uterus, and vagina. This screening involves a pelvic exam, which includes checking for microscopic changes to the surface of your cervix (Pap test).  For women ages 21-65, health care providers may recommend a pelvic exam and a Pap test every three years. For women ages 79-65, they may recommend the Pap test and pelvic exam, combined with testing for human papilloma virus (HPV), every five years. Some types of HPV increase your risk of cervical cancer. Testing for HPV may also be done on women of any age who have unclear Pap test results.  Other health care providers may not recommend any screening for  nonpregnant women who are considered low risk for pelvic cancer and have no symptoms. Ask your health care provider if a screening pelvic exam is right for you.  If you have had past treatment for cervical cancer or a condition that could lead to cancer, you need Pap tests and screening for cancer for at least 20 years after your treatment. If Pap tests have been discontinued for you, your risk factors (such as having a new sexual partner) need to be reassessed to determine if you should start having screenings again. Some women have medical problems that increase the chance of getting cervical cancer. In these cases, your health care provider may recommend that you have screening and Pap tests more often.  If you have a family history of uterine cancer or ovarian cancer, talk with your health care provider about genetic screening.  If you have vaginal bleeding after reaching menopause, tell your health care provider.  There are currently no reliable tests available to screen for ovarian cancer. Lung Cancer  Lung cancer screening is recommended for adults 5-57 years old who are at high risk for lung cancer because of a history of smoking. A yearly low-dose CT scan of the lungs is recommended if you:  Currently smoke.  Have a history of at least 30 pack-years of smoking and you currently smoke or have quit within the past 15 years.  A pack-year is smoking an average of one pack of cigarettes per day for one year. Yearly screening should:  Continue until it has been 15 years since you quit.  Stop if you develop a health problem that would prevent you from having lung cancer treatment. Colorectal Cancer  This type of cancer can be detected and can often be prevented.  Routine colorectal cancer screening usually begins at age 18 and continues through age 38.  If you have risk factors for colon cancer, your health care provider may recommend that you be screened at an earlier age.  If you have  a family history of colorectal cancer, talk with your health care provider about genetic screening.  Your health care provider may also recommend using home test kits to check for hidden blood in your stool.  A small camera at the end of a tube can be used to examine your colon directly (sigmoidoscopy or colonoscopy). This is done to check for the earliest forms of colorectal cancer.  Direct examination of the colon should be repeated every 5-10 years until age 31. However, if early forms of precancerous polyps or small growths are found or if you have a family history or genetic risk for colorectal cancer, you may need to be screened more often. Skin Cancer  Check your skin from head to toe regularly.  Monitor any moles. Be sure to tell your health care provider:  About any new moles or changes in moles, especially if there is a change in a mole's shape or color.  If you have a mole that is larger than the size of a pencil eraser.  If any of your family members has a history of skin cancer, especially at a young age, talk with your health care provider about genetic screening.  Always use sunscreen. Apply sunscreen liberally and repeatedly throughout the day.  Whenever you are outside, protect yourself by wearing long sleeves, pants, a wide-brimmed hat, and sunglasses. What should I know about osteoporosis? Osteoporosis is a condition in which bone destruction happens more quickly than new bone creation. After menopause, you may be at an increased risk for osteoporosis. To help prevent osteoporosis or the bone fractures that can happen because of osteoporosis, the following is recommended:  If you are 56-58 years old, get at least 1,000 mg of calcium and at least 600 mg of vitamin D per day.  If you are older than age 52 but younger than age 58, get at least 1,200 mg of calcium and at least 600 mg of vitamin D per day.  If you are older than age 85, get at least 1,200 mg of calcium and  at least 800 mg of vitamin D per day. Smoking and excessive alcohol intake increase the risk of osteoporosis. Eat foods that are rich in calcium and vitamin D, and do weight-bearing exercises several times each week as directed by your health care provider. What should I know about how menopause affects my mental health? Depression may occur at any age, but it is more common as you become older. Common symptoms of depression include:  Low or sad mood.  Changes in sleep patterns.  Changes in appetite or eating patterns.  Feeling an overall lack of motivation or enjoyment of activities that you previously enjoyed.  Frequent crying spells. Talk with your health care provider if you think that you are experiencing depression. What should I know about immunizations? It is important that you get and maintain your immunizations. These  include:  Tetanus, diphtheria, and pertussis (Tdap) booster vaccine.  Influenza every year before the flu season begins.  Pneumonia vaccine.  Shingles vaccine. Your health care provider may also recommend other immunizations. This information is not intended to replace advice given to you by your health care provider. Make sure you discuss any questions you have with your health care provider. Document Released: 07/29/2005 Document Revised: 12/25/2015 Document Reviewed: 03/10/2015 Elsevier Interactive Patient Education  2017 [employer].

## 2016-11-03 NOTE — Progress Notes (Signed)
Patient ID: Lindsay Lane, female   DOB: April 01, 1961, 56 y.o.   MRN: [id number] ANNUAL PREVENTATIVE CARE GYN  ENCOUNTER NOTE  Subjective:       Lindsay Lane is a 56 y.o. G78P1001 female here for a routine annual gynecologic exam.  Current complaints: 1.  Vaginal dryness;Currently using Premarin cream intravaginal twice a week; desires alternative   56 y.o. G1P1001, s/p partial abdominal hyserectomy ([place]?), post-menopausal female, not on HRT, presents for well woman exam. Occasional hot flashes are noted. She does not desire systemic HRT Vaginal dryness is ongoing; desires alternative to Premarin cream. Patient is not currently sexually active. Chronic constipation symptoms persist; uses MiraLAX occasionally No significant urinary incontinence or unstable bladder symptoms. No significant major interval health issues  Gynecologic History No LMP recorded. Patient has had a hysterectomy. Supracervical hysterectomy [facility]?) Contraception: status post hysterectomy Last Pap: 10/2015 neg/neg. Results were: abnormal Last mammogram: 10/2015 birad 1. Results were: normal  Obstetric History OB History  Gravida Para Term Preterm AB Living  _0 SAB TAB Ectopic Multiple Live Births          1    # Outcome Date GA Lbr Len/2nd Weight Sex Delivery Anes PTL Lv  1 Term    9 lb 4.8 oz (4.218 kg) F Vag-Spont   LIV      Past Medical History:  Diagnosis Date  . DDD (degenerative disc disease), lumbar   . Fibroids   . GERD (gastroesophageal reflux disease)   . History of heavy periods   . Reflux   . Ruptured disk   . Seasonal allergies     Past Surgical History:  Procedure Laterality Date  . LAPAROSCOPIC  HYSTERECTOMY [facility]?)   2008     . ANAL RECTAL MANOMETRY N/A 10/26/2015   Procedure: ANO RECTAL MANOMETRY;  Surgeon: Mauri Pole, MD;  Location: [facility] ENDOSCOPY;  Service: Endoscopy;  Laterality: N/A;  . [place], 2000   x 2  . NASAL SINUS SURGERY  11/2011  . VESICOVAGINAL  FISTULA CLOSURE W/ TAH  2007    Current Outpatient Prescriptions on File Prior to Visit  Medication Sig Dispense Refill  . albuterol (PROVENTIL HFA;VENTOLIN HFA) 108 (90 BASE) MCG/ACT inhaler Inhale 2 puffs into the lungs every 6 (six) hours as needed. Every 2-3 hours    . ALLERGIST TRAY 1CC 27GX1/2" 27G X 1/2" 1 ML KIT     . Ascorbic Acid (VITAMIN C) 100 MG tablet Take 100 mg by mouth daily.    . benzonatate (TESSALON) 100 MG capsule Take 2 capsules (200 mg total) by mouth 3 (three) times daily as needed. 45 capsule 0  . cetirizine (ZYRTEC) 10 MG tablet Take 10 mg by mouth daily.    . chlorpheniramine-HYDROcodone (TUSSIONEX PENNKINETIC ER) 10-8 MG/5ML SUER Take 5 mLs by mouth every 12 (twelve) hours as needed for cough. 115 mL 0  . cholecalciferol (VITAMIN D) 1000 units tablet Take 1,000 Units by mouth daily.    . clobetasol ointment (TEMOVATE) 9.45 % APPLY ONE APPLICATION TOPICALLY TWICE DAILY FOR  ONE  WEEK,  THEN  ONCE  A  DAY  FOR  ONE  WEEK 30 g 1  . conjugated estrogens (PREMARIN) vaginal cream Place 0.5 Applicatorfuls vaginally daily. 1/2 gram twice weekly 42.5 g 12  . cyanocobalamin 2000 MCG tablet Take 2,000 mcg by mouth daily.    . fluticasone (FLONASE) 50 MCG/ACT nasal spray     .  mometasone-formoterol (DULERA) 100-5 MCG/ACT AERO Take 2 puffs first thing in am and then another 2 puffs about 12 hours later as needed    . Multiple Vitamins-Minerals (MULTIVITAMIN WITH MINERALS) tablet Take 1 tablet by mouth daily.    Marland Kitchen omeprazole (PRILOSEC OTC) 20 MG tablet Take 20 mg by mouth daily before breakfast.     . oseltamivir (TAMIFLU) 75 MG capsule Take 1 capsule (75 mg total) by mouth 2 (two) times daily. 10 capsule 0  . predniSONE (DELTASONE) 20 MG tablet Take 2 tablets (40 mg total) by mouth daily with breakfast. 10 tablet 0  . timolol (TIMOPTIC) 0.5 % ophthalmic solution      No current facility-administered medications on file prior to visit.     No Known Allergies  Social History    Social History  . Marital status: Single    Spouse name: N/A  . Number of children: N/A  . Years of education: N/A   Occupational History  . Glass blower/designer [employer]   Social History Main Topics  . Smoking status: Former Smoker    Packs/day: 0.30    Years: 20.00    Types: Cigarettes    Quit date: 03/21/2011  . Smokeless tobacco: Never Used  . Alcohol use Yes     Comment: occas  . Drug use: No  . Sexual activity: Not Currently    Birth control/ protection: Surgical   Other Topics Concern  . Not on file   Social History Narrative  . No narrative on file    Family History  Problem Relation Age of Onset  . Emphysema Paternal Grandfather        was a smoker  . Emphysema Maternal Grandfather        was a smoker  . Colon cancer Maternal Grandfather   . Allergies Mother   . Diabetes Mother   . Allergies Sister   . Heart disease Father   . Lymphoma Father   . Colon cancer Maternal Grandmother   . Breast cancer Neg Hx   . Ovarian cancer Neg Hx     The following portions of the patient's history were reviewed and updated as appropriate: allergies, current medications, past family history, past medical history, past social history, past surgical history and problem list.  Review of Systems Review of Systems  Constitutional: Negative.        No significant vasomotor symptoms  HENT: Negative.   Eyes: Negative.   Respiratory: Negative.  Negative for hemoptysis, sputum production, shortness of breath and wheezing.   Cardiovascular: Negative.   Gastrointestinal: Positive for constipation.       History of chronic constipation; occasionally uses MiraLAX  Genitourinary: Negative.  Negative for frequency and urgency.       Nocturia 1-2 per night  Musculoskeletal: Negative.   Skin: Negative.   Neurological: Negative.   Endo/Heme/Allergies: Positive for environmental allergies.  Psychiatric/Behavioral: Negative.    Objective:   BP 135/81   Pulse 62   Ht '5\' 3"'$   (1.6 m)   Wt 148 lb 3.2 oz (67.2 kg)   BMI 26.25 kg/m  CONSTITUTIONAL: Well-developed, well-nourished female in no acute distress.  PSYCHIATRIC: Normal mood and affect. Normal behavior. Normal judgment and thought content. [place]: Alert and oriented to person, place, and time. Normal muscle tone coordination. No cranial nerve deficit noted. HENT:  Normocephalic, atraumatic, External right and left ear normal. Oropharynx is clear and moist EYES: Conjunctivae and EOM are normal.No scleral icterus.  NECK: Normal range of motion,  supple, no masses.  Normal thyroid.  SKIN: Skin is warm and dry. No rash noted. Not diaphoretic. No erythema. No pallor. CARDIOVASCULAR: Normal heart rate noted, regular rhythm, no murmur, rubs or gallops. RESPIRATORY: Clear to auscultation bilaterally. Effort and breath sounds normal, no problems with respiration noted. BREASTS: Symmetric in size. No masses, skin changes, nipple drainage, or lymphadenopathy. ABDOMEN: Soft, normal bowel sounds, no distention noted.  No tenderness, rebound or guarding.  BLADDER: Normal PELVIC:  External Genitalia: Normal  BUS: Normal  Vagina: moderate vaginal atrophy, pale tissues, lack of rugae; 1 degree cystocele, mild-moderate rectocele  Cervix: normal, no lesions noted  Uterus: surgically absent  Adnexa: Normal, no tenderness  RV: External Exam NormaI, No Rectal Masses and Normal Sphincter tone  MUSCULOSKELETAL: Normal range of motion. No tenderness.  No cyanosis, clubbing, or edema.  2+ distal pulses. LYMPHATIC: No Axillary, Supraclavicular, or Inguinal Adenopathy.    Assessment:   Annual gynecologic examination 56 y.o. Contraception: status post hysterectomy status post LSH Normal BMI  First-degree cystocele, asymptomatic Chronic constipation Mild to moderate rectocele Vaginal atrophy Plan:  Pap: due 2020 Mammogram: Ordered Stool Guaiac Testing:  Ordered Labs: vit d tsh lipid a1c fbs Routine preventative health  maintenance measures emphasized: Discussed importance of Vit D, Calcium supplements and weight bearing exercises in the prevention of osteoporosis., Exercise/Diet/Weight control, Tobacco Warnings, Alcohol/Substance use risks and Stress Management  Stopped Premarin cream intravaginal Trial of Intrarosa vaginal suppositories nightly Return in 1 year for annual exam  Joyice Faster, CMA  Brayton Mars, MD   Note: This dictation was prepared with Dragon dictation along with smaller phrase technology. Any transcriptional errors that result from this process are unintentional.

## 2016-11-04 LAB — HEMOGLOBIN A1C
Est. average glucose Bld gHb Est-mCnc: 103 mg/dL
HEMOGLOBIN A1C: 5.2 % (ref 4.8–5.6)

## 2016-11-04 LAB — TSH: TSH: 1.5 u[IU]/mL (ref 0.450–4.500)

## 2016-11-04 LAB — LIPID PANEL
CHOL/HDL RATIO: 2.9 ratio (ref 0.0–4.4)
Cholesterol, Total: 174 mg/dL (ref 100–199)
HDL: 61 mg/dL (ref >39)
LDL Calculated: 94 mg/dL (ref 0–99)
Triglycerides: 96 mg/dL (ref 0–149)
VLDL Cholesterol Cal: 19 mg/dL (ref 5–40)

## 2016-11-04 LAB — GLUCOSE, RANDOM: Glucose: 78 mg/dL (ref 65–99)

## 2016-11-09 ENCOUNTER — Other Ambulatory Visit: Payer: Self-pay

## 2017-01-13 ENCOUNTER — Ambulatory Visit
Admission: RE | Admit: 2017-01-13 | Discharge: 2017-01-13 | Disposition: A | Discharge status: Home / Self Care | Payer: BC Managed Care – PPO | Source: Ambulatory Visit | Attending: Obstetrics and Gynecology | Admitting: Obstetrics and Gynecology

## 2017-01-13 DIAGNOSIS — Z1231 Encounter for screening mammogram for malignant neoplasm of breast: Secondary | ICD-10-CM | POA: Diagnosis present

## 2017-06-22 ENCOUNTER — Encounter: Payer: Self-pay | Admitting: Family Medicine

## 2017-06-22 ENCOUNTER — Ambulatory Visit: Payer: BC Managed Care – PPO | Admitting: Family Medicine

## 2017-06-22 VITALS — BP 144/85 | HR 62 | Temp 97.8°F | Wt 161.0 lb

## 2017-06-22 DIAGNOSIS — J069 Acute upper respiratory infection, unspecified: Secondary | ICD-10-CM | POA: Diagnosis not present

## 2017-06-22 MED ORDER — AZITHROMYCIN 250 MG PO TABS: ORAL_TABLET | ORAL | 0 refills | Status: DC

## 2017-06-22 MED ORDER — HYDROCOD POLST-CPM POLST ER 10-8 MG/5ML PO SUER: 5.0000 mL | Freq: Two times a day (BID) | ORAL | 0 refills | Status: DC | PRN

## 2017-06-22 NOTE — Progress Notes (Signed)
   BP (!) 144/85   Pulse 62   Temp 97.8 F (36.6 C) (Oral)   Wt 161 lb (73 kg)   SpO2 98%   BMI 28.52 kg/m    Subjective:    Patient ID: BARBI KUMAGAI, female    DOB: 01-Oct-1960, 57 y.o.   MRN: [id number]  HPI: JAELIANA LOCOCO is a 57 y.o. female  Chief Complaint  Patient presents with  . Cough    X 1 month.   . Fatigue   Productive cough, congestion, fatigue, wheezing x 1 month. Started to improve with mucinex but now is feeling much worse. Having to use albuterol quite frequently. Denies fever, chills, body aches. Several sick contacts at work.   Relevant past medical, surgical, family and social history reviewed and updated as indicated. Interim medical history since our last visit reviewed. Allergies and medications reviewed and updated.  Review of Systems  Constitutional: Positive for fatigue.  HENT: Positive for congestion.   Eyes: Negative.   Respiratory: Positive for cough and wheezing.   Cardiovascular: Negative.   Gastrointestinal: Negative.   Genitourinary: Negative.   Musculoskeletal: Negative.   Neurological: Negative.   Psychiatric/Behavioral: Negative.    Per HPI unless specifically indicated above     Objective:    BP (!) 144/85   Pulse 62   Temp 97.8 F (36.6 C) (Oral)   Wt 161 lb (73 kg)   SpO2 98%   BMI 28.52 kg/m   Wt Readings from Last 3 Encounters:  06/22/17 161 lb (73 kg)  11/03/16 148 lb 3.2 oz (67.2 kg)  07/13/16 149 lb (67.6 kg)    Physical Exam  Constitutional: She is oriented to person, place, and time. She appears well-developed and well-nourished. No distress.  HENT:  Head: Atraumatic.  Right Ear: External ear normal.  Left Ear: External ear normal.  Mouth/Throat: No oropharyngeal exudate.  Oropharynx and nasal mucosa erythematous Drainage present in nares  Eyes: Conjunctivae are normal. Pupils are equal, round, and reactive to light. No scleral icterus.  Neck: Normal range of motion. Neck supple.  Cardiovascular: Normal  rate and normal heart sounds.  Pulmonary/Chest: Effort normal. No respiratory distress. She has wheezes.  Musculoskeletal: Normal range of motion.  Neurological: She is alert and oriented to person, place, and time.  Skin: Skin is warm and dry.  Psychiatric: She has a normal mood and affect. Her behavior is normal.  Nursing note and vitals reviewed.     Assessment & Plan:   Problem List Items Addressed This Visit    None    Visit Diagnoses    Upper respiratory tract infection, unspecified type    -  Primary   Will treat with zpack and tussionex as well as continued allergy regimen and albuterol prn. Supportive care reviewed. F/u as needed   Relevant Medications   azithromycin (ZITHROMAX) 250 MG tablet       Follow up plan: Return if symptoms worsen or fail to improve.

## 2017-06-25 NOTE — Patient Instructions (Signed)
Follow up as needed

## 2017-07-10 ENCOUNTER — Telehealth: Payer: Self-pay | Admitting: Unknown Physician Specialty

## 2017-07-10 MED ORDER — BENZONATATE 200 MG PO CAPS: 200.0000 mg | ORAL_CAPSULE | Freq: Two times a day (BID) | ORAL | 0 refills | Status: DC | PRN

## 2017-07-10 MED ORDER — DOXYCYCLINE HYCLATE 100 MG PO TABS: 100.0000 mg | ORAL_TABLET | Freq: Two times a day (BID) | ORAL | 0 refills | Status: DC

## 2017-07-10 NOTE — Telephone Encounter (Signed)
Copied from [place] [id number]. Topic: Quick Communication - See Telephone Encounter >> Jul 10, 2017 10:14 AM Antonieta Iba C wrote:    CRM for notification. See Telephone encounter for: pt was seen and prescribed a z-pak and cough syrup pt says that she felt better after  completing but now she is having symptoms again, pt would like to know if provider would send something in to pharmacy for her?   Pharmacy: [facility] [address], [place] - [place]: [phone]  07/10/17.

## 2017-07-10 NOTE — Telephone Encounter (Signed)
Tessalon sent

## 2017-07-10 NOTE — Telephone Encounter (Signed)
Patient notified. Patient asked if she could have some tessalon pearls as well because she is coughing during the day.

## 2017-07-10 NOTE — Addendum Note (Signed)
Addended by: Merrie Roof E on: 07/10/2017 12:44 PM   Modules accepted: Orders

## 2017-07-10 NOTE — Telephone Encounter (Signed)
Sent in another antibiotic, she should come in if no better after that

## 2017-07-10 NOTE — Telephone Encounter (Signed)
Routing to provider  

## 2017-07-26 ENCOUNTER — Encounter: Payer: Self-pay | Admitting: Gastroenterology

## 2017-07-28 ENCOUNTER — Ambulatory Visit
Admission: RE | Admit: 2017-07-28 | Discharge: 2017-07-28 | Disposition: A | Discharge status: Home / Self Care | Payer: BC Managed Care – PPO | Source: Ambulatory Visit | Attending: Unknown Physician Specialty | Admitting: Unknown Physician Specialty

## 2017-07-28 ENCOUNTER — Encounter: Payer: Self-pay | Admitting: Unknown Physician Specialty

## 2017-07-28 ENCOUNTER — Ambulatory Visit: Payer: Self-pay | Admitting: *Deleted

## 2017-07-28 ENCOUNTER — Ambulatory Visit: Payer: BC Managed Care – PPO | Admitting: Unknown Physician Specialty

## 2017-07-28 VITALS — BP 133/87 | HR 77 | Temp 98.5°F | Wt 155.6 lb

## 2017-07-28 DIAGNOSIS — R0602 Shortness of breath: Secondary | ICD-10-CM | POA: Diagnosis not present

## 2017-07-28 DIAGNOSIS — J4541 Moderate persistent asthma with (acute) exacerbation: Secondary | ICD-10-CM | POA: Diagnosis not present

## 2017-07-28 DIAGNOSIS — R05 Cough: Secondary | ICD-10-CM | POA: Diagnosis present

## 2017-07-28 MED ORDER — ALBUTEROL SULFATE (2.5 MG/3ML) 0.083% IN NEBU
2.5000 mg | INHALATION_SOLUTION | Freq: Once | RESPIRATORY_TRACT | Status: AC
  Administered 2017-07-28: 2.5 mg via RESPIRATORY_TRACT

## 2017-07-28 MED ORDER — ALBUTEROL SULFATE HFA 108 (90 BASE) MCG/ACT IN AERS: 2.0000 | INHALATION_SPRAY | Freq: Four times a day (QID) | RESPIRATORY_TRACT | 2 refills | Status: DC | PRN

## 2017-07-28 MED ORDER — PREDNISONE 20 MG PO TABS
40.0000 mg | ORAL_TABLET | Freq: Every day | ORAL | 0 refills | Status: DC
Start: 2017-07-28 — End: 2017-08-04

## 2017-07-28 MED ORDER — BUDESONIDE-FORMOTEROL FUMARATE 160-4.5 MCG/ACT IN AERO: 2.0000 | INHALATION_SPRAY | Freq: Two times a day (BID) | RESPIRATORY_TRACT | 3 refills | Status: DC

## 2017-07-28 NOTE — Progress Notes (Signed)
BP 133/87   Pulse 77   Temp 98.5 F (36.9 C) (Oral)   Wt 155 lb 9.6 oz (70.6 kg)   SpO2 94%   BMI 27.56 kg/m    Subjective:    Patient ID: Lindsay Lane, female    DOB: 11-11-1960, 57 y.o.   MRN: [id number]  HPI: Lindsay Lane is a 57 y.o. female  Chief Complaint  Patient presents with  . URI    pt states she has still been having chest congestion, wheezing and a cough. Has already completed amoxicillin, z pack, cough syrup and tessalon    Cough  Chronicity: 6 weeks. The problem has been gradually worsening. The problem occurs constantly. The cough is non-productive. Associated symptoms include chest pain, ear congestion, nasal congestion, rhinorrhea, shortness of breath and wheezing. Pertinent negatives include no chills, ear pain, fever, headaches, heartburn, hemoptysis, myalgias, rash, sore throat, sweats or weight loss. The symptoms are aggravated by lying down. She has tried a beta-agonist inhaler (She has been on Z pack plus Amoxil) for the symptoms. Her past medical history is significant for asthma.    Social History   Socioeconomic History  . Marital status: Single    Spouse name: Not on file  . Number of children: Not on file  . Years of education: Not on file  . Highest education level: Not on file  Social Needs  . Financial resource strain: Not on file  . Food insecurity - worry: Not on file  . Food insecurity - inability: Not on file  . Transportation needs - medical: Not on file  . Transportation needs - non-medical: Not on file  Occupational History  . Occupation: Therapist, sports: [employer]  Tobacco Use  . Smoking status: Former Smoker    Packs/day: 0.30    Years: 20.00    Pack years: 6.00    Types: Cigarettes    Last attempt to quit: 03/21/2011    Years since quitting: 6.3  . Smokeless tobacco: Never Used  Substance and Sexual Activity  . Alcohol use: Yes    Comment: occas  . Drug use: No  . Sexual activity: Not Currently   Birth control/protection: Surgical  Other Topics Concern  . Not on file  Social History Narrative  . Not on file   Family History  Problem Relation Age of Onset  . Emphysema Paternal Grandfather        was a smoker  . Emphysema Maternal Grandfather        was a smoker  . Colon cancer Maternal Grandfather   . Allergies Mother   . Diabetes Mother   . Allergies Sister   . Heart disease Father   . Lymphoma Father   . Colon cancer Maternal Grandmother   . Breast cancer Neg Hx   . Ovarian cancer Neg Hx     Relevant past medical, surgical, family and social history reviewed and updated as indicated. Interim medical history since our last visit reviewed. Allergies and medications reviewed and updated.  Review of Systems  Constitutional: Negative for chills, fever and weight loss.  HENT: Positive for rhinorrhea. Negative for ear pain and sore throat.   Respiratory: Positive for cough, shortness of breath and wheezing. Negative for hemoptysis.   Cardiovascular: Positive for chest pain.  Gastrointestinal: Negative for heartburn.  Musculoskeletal: Negative for myalgias.  Skin: Negative for rash.  Neurological: Negative for headaches.    Per HPI unless specifically indicated above  Objective:    BP 133/87   Pulse 77   Temp 98.5 F (36.9 C) (Oral)   Wt 155 lb 9.6 oz (70.6 kg)   SpO2 94%   BMI 27.56 kg/m   Wt Readings from Last 3 Encounters:  07/28/17 155 lb 9.6 oz (70.6 kg)  06/22/17 161 lb (73 kg)  11/03/16 148 lb 3.2 oz (67.2 kg)    Physical Exam  Constitutional: She is oriented to person, place, and time. She appears well-developed and well-nourished. No distress.  HENT:  Head: Normocephalic and atraumatic.  Right Ear: Tympanic membrane and ear canal normal.  Left Ear: Tympanic membrane and ear canal normal.  Nose: Rhinorrhea present. Right sinus exhibits no maxillary sinus tenderness and no frontal sinus tenderness. Left sinus exhibits no maxillary sinus  tenderness and no frontal sinus tenderness.  Mouth/Throat: Mucous membranes are normal. Posterior oropharyngeal erythema present.  Eyes: Conjunctivae and lids are normal. Right eye exhibits no discharge. Left eye exhibits no discharge. No scleral icterus.  Cardiovascular: Normal rate and regular rhythm.  Pulmonary/Chest: Effort normal. No respiratory distress. She has decreased breath sounds in the right lower field and the left lower field. She has wheezes. She has rhonchi.  Abdominal: Normal appearance. There is no splenomegaly or hepatomegaly.  Musculoskeletal: Normal range of motion.  Neurological: She is alert and oriented to person, place, and time.  Skin: Skin is intact. No rash noted. No pallor.  Psychiatric: She has a normal mood and affect. Her behavior is normal. Judgment and thought content normal.    Results for orders placed or performed in visit on 11/03/16  Glucose, random  Result Value Ref Range   Glucose 78 65 - 99 mg/dL  Hemoglobin A1c  Result Value Ref Range   Hgb A1c MFr Bld 5.2 4.8 - 5.6 %   Est. average glucose Bld gHb Est-mCnc 103 mg/dL  Lipid panel  Result Value Ref Range   Cholesterol, Total 174 100 - 199 mg/dL   Triglycerides 96 0 - 149 mg/dL   HDL 61 >39 mg/dL   VLDL Cholesterol Cal 19 5 - 40 mg/dL   LDL Calculated 94 0 - 99 mg/dL   Chol/HDL Ratio 2.9 0.0 - 4.4 ratio  TSH  Result Value Ref Range   TSH 1.500 0.450 - 4.500 uIU/mL      Assessment & Plan:   Problem List Items Addressed This Visit    None    Visit Diagnoses    Moderate persistent reactive airway disease with acute exacerbation    -  Primary   New problem or me and worsening.  Prednisone 40 mg.  Symbicort 2 puffs BID.  Prednisone 40 mg Daily for 5 days.  Recheck one week to assess asthma/COPD   Relevant Medications   albuterol (PROVENTIL) (2.5 MG/3ML) 0.083% nebulizer solution 2.5 mg (Completed)   albuterol (PROVENTIL HFA;VENTOLIN HFA) 108 (90 Base) MCG/ACT inhaler   predniSONE  (DELTASONE) 20 MG tablet   budesonide-formoterol (SYMBICORT) 160-4.5 MCG/ACT inhaler   Other Relevant Orders   DG Chest 2 View       Follow up plan: Return in about 1 week (around 08/04/2017) for Spirometry.

## 2017-07-28 NOTE — Telephone Encounter (Signed)
Patient is having trouble sleeping at night- she is having to use her inhaler. Patient reports wheezing in chest and SOB.patient has been treated twice for URI and she is still having chest congestion with wheezing that is causing breathing difficulty. Appointment today.  Reason for Disposition . [1] MILD difficulty breathing (e.g., minimal/no SOB at rest, SOB with walking, pulse <100) AND [2] NEW-onset or WORSE than normal  Answer Assessment - Initial Assessment Questions 1. RESPIRATORY STATUS: "Describe your breathing?" (e.g., wheezing, shortness of breath, unable to speak, severe coughing)      Patient has good/bad days- she has had 2 bad days- she is having to breath really deep to feel like she is getting good air 2. ONSET: "When did this breathing problem begin?"      2 months 3. PATTERN "Does the difficult breathing come and go, or has it been constant since it started?"      Comes and goes- but it is getting worse 4. SEVERITY: "How bad is your breathing?" (e.g., mild, moderate, severe)    - MILD: No SOB at rest, mild SOB with walking, speaks normally in sentences, can lay down, no retractions, pulse < 100.    - MODERATE: SOB at rest, SOB with minimal exertion and prefers to sit, cannot lie down flat, speaks in phrases, mild retractions, audible wheezing, pulse 100-120.    - SEVERE: Very SOB at rest, speaks in single words, struggling to breathe, sitting hunched forward, retractions, pulse > 120      moderate 5. RECURRENT SYMPTOM: "Have you had difficulty breathing before?" If so, ask: "When was the last time?" and "What happened that time?"      Yes- patient has had pneumonia before- few years ago- was treated took a long time to get rid of it 6. CARDIAC HISTORY: "Do you have any history of heart disease?" (e.g., heart attack, angina, bypass surgery, angioplasty)      no 7. LUNG HISTORY: "Do you have any history of lung disease?"  (e.g., pulmonary embolus, asthma, emphysema)     no 8.  CAUSE: "What do you think is causing the breathing problem?"      respiratory infection 9. OTHER SYMPTOMS: "Do you have any other symptoms? (e.g., dizziness, runny nose, cough, chest pain, fever)     Cough, sore in chest from coughing 10. PREGNANCY: "Is there any chance you are pregnant?" "When was your last menstrual period?"       n/a 11. TRAVEL: "Have you traveled out of the country in the last month?" (e.g., travel history, exposures)       no  Protocols used: BREATHING DIFFICULTY-A-AH

## 2017-07-31 NOTE — Progress Notes (Signed)
Notified pt by mychart

## 2017-08-04 ENCOUNTER — Ambulatory Visit: Payer: BC Managed Care – PPO | Admitting: Unknown Physician Specialty

## 2017-08-04 ENCOUNTER — Encounter: Payer: Self-pay | Admitting: Unknown Physician Specialty

## 2017-08-04 VITALS — BP 138/84 | HR 68 | Temp 98.1°F | Wt 156.6 lb

## 2017-08-04 DIAGNOSIS — J4541 Moderate persistent asthma with (acute) exacerbation: Secondary | ICD-10-CM

## 2017-08-04 DIAGNOSIS — R059 Cough, unspecified: Secondary | ICD-10-CM

## 2017-08-04 DIAGNOSIS — J320 Chronic maxillary sinusitis: Secondary | ICD-10-CM

## 2017-08-04 DIAGNOSIS — R05 Cough: Secondary | ICD-10-CM | POA: Diagnosis not present

## 2017-08-04 MED ORDER — CEFDINIR 300 MG PO CAPS: 300.0000 mg | ORAL_CAPSULE | Freq: Two times a day (BID) | ORAL | 0 refills | Status: DC

## 2017-08-04 MED ORDER — HYDROCOD POLST-CPM POLST ER 10-8 MG/5ML PO SUER: 5.0000 mL | Freq: Two times a day (BID) | ORAL | 0 refills | Status: DC | PRN

## 2017-08-04 NOTE — Patient Instructions (Signed)
Florastar Culturelle

## 2017-08-04 NOTE — Progress Notes (Signed)
BP 138/84   Pulse 68   Temp 98.1 F (36.7 C) (Oral)   Wt 156 lb 9.6 oz (71 kg)   SpO2 96%   BMI 27.74 kg/m    Subjective:    Patient ID: Lindsay Lane, female    DOB: [birth year], 57 y.o.   MRN: [id number]  HPI: Lindsay Lane is a 57 y.o. female  Chief Complaint  Patient presents with  . URI    1 week f/up   Pt is here with her daughter  Cough Pt states she is doing better but is continuing to cough.  Was given Prednisone for 5 days.  She is having persistent sinus drainage and cough.  She starts feeling better and then gets sick again and is frustrated.  She is sleeping poorly at night due to cough.  Symptoms are worse at night.    She is taking inhalers as directed.  However, she showed poor inhaler technique with 2 puffs at once.  Spirometry was normal  Relevant past medical, surgical, family and social history reviewed and updated as indicated. Interim medical history since our last visit reviewed. Allergies and medications reviewed and updated.  Review of Systems  Constitutional: Positive for fatigue.  HENT: Positive for congestion.   Eyes: Negative.   Respiratory: Positive for cough.   Cardiovascular: Negative.   Gastrointestinal: Negative.   Endocrine: Negative.   Genitourinary: Negative.   Musculoskeletal: Negative.   Skin: Negative.   Allergic/Immunologic: Negative.   Neurological: Negative.   Hematological: Negative.   Psychiatric/Behavioral: Negative.     Per HPI unless specifically indicated above     Objective:    BP 138/84   Pulse 68   Temp 98.1 F (36.7 C) (Oral)   Wt 156 lb 9.6 oz (71 kg)   SpO2 96%   BMI 27.74 kg/m   Wt Readings from Last 3 Encounters:  08/04/17 156 lb 9.6 oz (71 kg)  07/28/17 155 lb 9.6 oz (70.6 kg)  06/22/17 161 lb (73 kg)    Physical Exam  Constitutional: She is oriented to person, place, and time. She appears well-developed and well-nourished. No distress.  HENT:  Head: Normocephalic and atraumatic.  Right  Ear: Tympanic membrane and ear canal normal.  Left Ear: Tympanic membrane and ear canal normal.  Nose: No rhinorrhea. Right sinus exhibits maxillary sinus tenderness. Right sinus exhibits no frontal sinus tenderness. Left sinus exhibits maxillary sinus tenderness. Left sinus exhibits no frontal sinus tenderness.  Eyes: Conjunctivae and lids are normal. Right eye exhibits no discharge. Left eye exhibits no discharge. No scleral icterus.  Cardiovascular: Normal rate and regular rhythm.  Pulmonary/Chest: Effort normal and breath sounds normal. No respiratory distress.  Abdominal: Normal appearance. There is no splenomegaly or hepatomegaly.  Musculoskeletal: Normal range of motion.  Neurological: She is alert and oriented to person, place, and time.  Skin: Skin is intact. No rash noted. No pallor.  Psychiatric: She has a normal mood and affect. Her behavior is normal. Judgment and thought content normal.    Results for orders placed or performed in visit on 11/03/16  Glucose, random  Result Value Ref Range   Glucose 78 65 - 99 mg/dL  Hemoglobin A1c  Result Value Ref Range   Hgb A1c MFr Bld 5.2 4.8 - 5.6 %   Est. average glucose Bld gHb Est-mCnc 103 mg/dL  Lipid panel  Result Value Ref Range   Cholesterol, Total 174 100 - 199 mg/dL   Triglycerides 96 0 - 149  mg/dL   HDL 61 >39 mg/dL   VLDL Cholesterol Cal 19 5 - 40 mg/dL   LDL Calculated 94 0 - 99 mg/dL   Chol/HDL Ratio 2.9 0.0 - 4.4 ratio  TSH  Result Value Ref Range   TSH 1.500 0.450 - 4.500 uIU/mL      Assessment & Plan:   Problem List Items Addressed This Visit    None    Visit Diagnoses    Moderate persistent reactive airway disease with acute exacerbation    -  Primary   This is improved with inhaler use.  Continue inhaler use   Relevant Orders   Spirometry with Graph (Completed)   Maxillary sinusitis, unspecified chronicity       Persistent sinusitis.  Will give Omnicef 300 mg BID x 10 days.     Relevant Medications    cefdinir (OMNICEF) 300 MG capsule   chlorpheniramine-HYDROcodone (TUSSIONEX PENNKINETIC ER) 10-8 MG/5ML SUER   Cough       Persistent cough unrelieved by other measures.  Will rx Tussionex q 12 hrs prn.  Do not take and drive       Follow up plan: Return if symptoms worsen or fail to improve.

## 2017-10-16 ENCOUNTER — Encounter: Payer: Self-pay | Admitting: Family Medicine

## 2017-10-16 ENCOUNTER — Ambulatory Visit: Payer: BC Managed Care – PPO | Admitting: Family Medicine

## 2017-10-16 VITALS — BP 131/84 | HR 67 | Temp 97.1°F | Wt 152.2 lb

## 2017-10-16 DIAGNOSIS — J069 Acute upper respiratory infection, unspecified: Secondary | ICD-10-CM | POA: Diagnosis not present

## 2017-10-16 DIAGNOSIS — N390 Urinary tract infection, site not specified: Secondary | ICD-10-CM

## 2017-10-16 MED ORDER — PREDNISONE 10 MG PO TABS: ORAL_TABLET | ORAL | 0 refills | Status: DC

## 2017-10-16 MED ORDER — AMOXICILLIN-POT CLAVULANATE 875-125 MG PO TABS: 1.0000 | ORAL_TABLET | Freq: Two times a day (BID) | ORAL | 0 refills | Status: DC

## 2017-10-16 NOTE — Progress Notes (Addendum)
BP 131/84 (BP Location: Left Arm, Patient Position: Sitting, Cuff Size: Normal)   Pulse 67   Temp (!) 97.1 F (36.2 C) (Oral)   Wt 152 lb 3.2 oz (69 kg)   SpO2 96%   BMI 26.96 kg/m    Subjective:    Patient ID: Lindsay Lane, female    DOB: 1960/09/16, 57 y.o.   MRN: [id number]  HPI: Lindsay Lane is a 57 y.o. female  Chief Complaint  Patient presents with  . Cough    x's 5 days. Only has became worse. Patient states it started out a head cold and now has chest heaviness.  . Nasal Congestion  . Headache  . Fatigue  . Dysuria   5 days of ear pain, facial pain and pressure, headache, congestion. Sxs worsening the past day or two and having increasing wheezing and chest tightness. Hx of reactive airway dz. Trying mucinex with minimal relief. Lots of sick contacts.   Also having several days of dysuria, urinary frequency, and suprapubic pressure. Denies discharge,vaginal itching or irritation, concern for STIs, abdominal pain, N/V/D. Has not tried anything for sxs.   Relevant past medical, surgical, family and social history reviewed and updated as indicated. Interim medical history since our last visit reviewed. Allergies and medications reviewed and updated.  Review of Systems  Per HPI unless specifically indicated above     Objective:    BP 131/84 (BP Location: Left Arm, Patient Position: Sitting, Cuff Size: Normal)   Pulse 67   Temp (!) 97.1 F (36.2 C) (Oral)   Wt 152 lb 3.2 oz (69 kg)   SpO2 96%   BMI 26.96 kg/m   Wt Readings from Last 3 Encounters:  10/16/17 152 lb 3.2 oz (69 kg)  08/04/17 156 lb 9.6 oz (71 kg)  07/28/17 155 lb 9.6 oz (70.6 kg)    Physical Exam  Constitutional: She is oriented to person, place, and time. She appears well-developed and well-nourished. No distress.  HENT:  Head: Atraumatic.  Right Ear: External ear normal.  Left Ear: External ear normal.  Oropharynx and nasal mucosa erythematous and edematous with thick drainage present.  B/l facial ttp  Eyes: Pupils are equal, round, and reactive to light. Conjunctivae and EOM are normal.  Neck: Normal range of motion. Neck supple.  Cardiovascular: Normal rate and normal heart sounds.  Pulmonary/Chest: Effort normal. No respiratory distress. She has wheezes.  Abdominal: Soft. Bowel sounds are normal. There is no tenderness.  Musculoskeletal: Normal range of motion. She exhibits no tenderness (No CVA tenderness b/l).  Lymphadenopathy:    She has no cervical adenopathy.  Neurological: She is alert and oriented to person, place, and time. She has normal strength. No cranial nerve deficit. She displays a negative Romberg sign.  Skin: Skin is warm and dry. No rash noted.  Psychiatric: She has a normal mood and affect. Her behavior is normal.  Nursing note and vitals reviewed.   Results for orders placed or performed in visit on 10/16/17  Microscopic Examination  Result Value Ref Range   WBC, UA 0-5 0 - 5 /hpf   RBC, UA None seen 0 - 2 /hpf   Epithelial Cells (non renal) 0-10 0 - 10 /hpf   Renal Epithel, UA 0-10 (A) None seen /hpf   Mucus, UA Present (A) Not Estab.   Bacteria, UA Few (A) None seen/Few  Urine Culture, Reflex  Result Value Ref Range   Urine Culture, Routine Final report (A)  Organism ID, Bacteria Comment (A)   UA/M w/rflx Culture, Routine  Result Value Ref Range   Specific Gravity, UA 1.015 1.005 - 1.030   pH, UA 6.5 5.0 - 7.5   Color, UA Yellow Yellow   Appearance Ur Clear Clear   Leukocytes, UA 1+ (A) Negative   Protein, UA Negative Negative/Trace   Glucose, UA Negative Negative   Ketones, UA Negative Negative   RBC, UA Negative Negative   Bilirubin, UA Negative Negative   Urobilinogen, Ur 0.2 0.2 - 1.0 mg/dL   Nitrite, UA Negative Negative   Microscopic Examination See below:    Urinalysis Reflex Comment       Assessment & Plan:   Problem List Items Addressed This Visit    None    Visit Diagnoses    Upper respiratory tract infection,  unspecified type    -  Primary   Tx with augmentin, prednisone, albuterol, continued OTC medications. Supportive care reviewed. Follow up if not improving   Acute lower UTI       U/A positive for UTI, will start augmentin for URI and hopefully cover UTI as well. Await cx and change if needed. Push fluids, probiotics   Relevant Orders   UA/M w/rflx Culture, Routine (Completed)       Follow up plan: Return if symptoms worsen or fail to improve.

## 2017-10-18 LAB — MICROSCOPIC EXAMINATION: RBC MICROSCOPIC, UA: NONE SEEN /HPF (ref 0–2)

## 2017-10-18 LAB — UA/M W/RFLX CULTURE, ROUTINE
Bilirubin, UA: NEGATIVE
Glucose, UA: NEGATIVE
Ketones, UA: NEGATIVE
Nitrite, UA: NEGATIVE
Protein, UA: NEGATIVE
RBC, UA: NEGATIVE
SPEC GRAV UA: 1.015 (ref 1.005–1.030)
Urobilinogen, Ur: 0.2 mg/dL (ref 0.2–1.0)
pH, UA: 6.5 (ref 5.0–7.5)

## 2017-10-18 LAB — URINE CULTURE, REFLEX

## 2017-10-19 NOTE — Patient Instructions (Signed)
Follow up as needed

## 2017-11-06 NOTE — Progress Notes (Signed)
Patient ID: Lindsay Lane, female   DOB: 03/15/1961, 57 y.o.   MRN: [id number] ANNUAL PREVENTATIVE CARE GYN  ENCOUNTER NOTE  Subjective:       Lindsay Lane is a 57 y.o. G53P1001 female here for a routine annual gynecologic exam.  Current complaints: 1.  intrarosa- doesn't like doing it daily- wants to try something else; vaginal dryness is an ongoing issue 2. Having minimal hot flashes- pos for nite sweats   57 y.o. G1P1001, s/p partial abdominal hyserectomy, post-menopausal female, not on HRT, presents for well woman exam. Patient is not currently sexually active. Chronic constipation symptoms persist; uses MiraLAX occasionally.  No significant changes in bowel function No significant urinary incontinence or unstable bladder symptoms. No significant major interval health issues other than issues with weight gain and weight loss intermittently; currently doing a boot camp 3 times a week for health and wellness.  Gynecologic History No LMP recorded. Patient has had a hysterectomy. Supracervical hysterectomy [facility]?) Contraception: status post hysterectomy Last Pap: 10/2015 neg/neg. Last mammogram: 01/06/2017 birad 1. Results were: normal  Obstetric History OB History  Gravida Para Term Preterm AB Living  1 1 1     1   SAB TAB Ectopic Multiple Live Births          1    # Outcome Date GA Lbr Len/2nd Weight Sex Delivery Anes PTL Lv  1 Term    9 lb 4.8 oz (4.218 kg) F Vag-Spont   LIV    Past Medical History:  Diagnosis Date  . DDD (degenerative disc disease), lumbar   . Fibroids   . GERD (gastroesophageal reflux disease)   . History of heavy periods   . Reflux   . Ruptured disk   . Seasonal allergies     Past Surgical History:  Procedure Laterality Date  . LAPAROSCOPIC  HYSTERECTOMY [facility]?)   2008     . ANAL RECTAL MANOMETRY N/A 10/26/2015   Procedure: ANO RECTAL MANOMETRY;  Surgeon: Mauri Pole, MD;  Location: [facility] ENDOSCOPY;  Service: Endoscopy;  Laterality: N/A;  . [place], 2000   x 2  . NASAL SINUS SURGERY  11/2011  . VESICOVAGINAL FISTULA CLOSURE W/ TAH  2007    Current Outpatient Medications on File Prior to Visit  Medication Sig Dispense Refill  . albuterol (PROVENTIL HFA;VENTOLIN HFA) 108 (90 BASE) MCG/ACT inhaler Inhale 2 puffs into the lungs every 6 (six) hours as needed. Every 2-3 hours    . albuterol (PROVENTIL HFA;VENTOLIN HFA) 108 (90 Base) MCG/ACT inhaler Inhale 2 puffs into the lungs every 6 (six) hours as needed for wheezing or shortness of breath. 1 Inhaler 2  . amoxicillin-clavulanate (AUGMENTIN) 875-125 MG tablet Take 1 tablet by mouth 2 (two) times daily. 20 tablet 0  . Ascorbic Acid (VITAMIN C) 100 MG tablet Take 100 mg by mouth daily.    . budesonide-formoterol (SYMBICORT) 160-4.5 MCG/ACT inhaler Inhale 2 puffs into the lungs 2 (two) times daily. (Patient not taking: Reported on 10/16/2017) 1 Inhaler 3  . cetirizine (ZYRTEC) 10 MG tablet Take 10 mg by mouth daily.    . cholecalciferol (VITAMIN D) 1000 units tablet Take 1,000 Units by mouth daily.    Marland Kitchen conjugated estrogens (PREMARIN) vaginal cream Place 0.5 Applicatorfuls vaginally daily. 1/2 gram twice weekly 42.5 g 12  . cyanocobalamin 2000 MCG tablet Take 2,000 mcg by mouth daily.    . fluticasone (FLONASE) 50 MCG/ACT nasal spray     . Multiple Vitamins-Minerals (MULTIVITAMIN  WITH MINERALS) tablet Take 1 tablet by mouth daily.    Marland Kitchen omeprazole (PRILOSEC OTC) 20 MG tablet Take 20 mg by mouth daily before breakfast.     . Prasterone (INTRAROSA) 6.5 MG INST Place 6.5 mg vaginally at bedtime. 30 each 6  . predniSONE (DELTASONE) 10 MG tablet Take 6 tabs day one, 5 tabs day two, 4 tabs day three, etc 21 tablet 0  . timolol (TIMOPTIC) 0.5 % ophthalmic solution      No current facility-administered medications on file prior to visit.     No Known Allergies  Social History   Socioeconomic History  . Marital status: Single    Spouse name: Not on file  . Number of children: Not  on file  . Years of education: Not on file  . Highest education level: Not on file  Occupational History  . Occupation: Therapist, sports: [employer]  . Financial resource strain: Not on file  . Food insecurity:    Worry: Not on file    Inability: Not on file  . Transportation needs:    Medical: Not on file    Non-medical: Not on file  Tobacco Use  . Smoking status: Former Smoker    Packs/day: 0.30    Years: 20.00    Pack years: 6.00    Types: Cigarettes    Last attempt to quit: 03/21/2011    Years since quitting: 6.6  . Smokeless tobacco: Never Used  Substance and Sexual Activity  . Alcohol use: Yes    Comment: occas  . Drug use: No  . Sexual activity: Not Currently    Birth control/protection: Surgical  Lifestyle  . Physical activity:    Days per week: Not on file    Minutes per session: Not on file  . Stress: Not on file  Relationships  . Social connections:    Talks on phone: Not on file    Gets together: Not on file    Attends religious service: Not on file    Active member of club or organization: Not on file    Attends meetings of clubs or organizations: Not on file    Relationship status: Not on file  . Intimate partner violence:    Fear of current or ex partner: Not on file    Emotionally abused: Not on file    Physically abused: Not on file    Forced sexual activity: Not on file  Other Topics Concern  . Not on file  Social History Narrative  . Not on file    Family History  Problem Relation Age of Onset  . Emphysema Paternal Grandfather        was a smoker  . Emphysema Maternal Grandfather        was a smoker  . Colon cancer Maternal Grandfather   . Allergies Mother   . Diabetes Mother   . Allergies Sister   . Heart disease Father   . Lymphoma Father   . Colon cancer Maternal Grandmother   . Breast cancer Neg Hx   . Ovarian cancer Neg Hx     The following portions of the patient's history were reviewed and updated  as appropriate: allergies, current medications, past family history, past medical history, past social history, past surgical history and problem list.  Review of Systems Review of Systems  Constitutional:       Intermittent hot flashes and night sweats, not on ERT  HENT: Negative.  Eyes: Negative.   Respiratory: Negative.   Cardiovascular: Negative.   Gastrointestinal: Negative.   Genitourinary: Negative.   Musculoskeletal: Negative.   Neurological: Negative.   Endo/Heme/Allergies: Negative.   Psychiatric/Behavioral: Negative.       Objective:   BP 124/75   Pulse 65   Ht 5\' 3"  (1.6 m)   Wt 154 lb 3.2 oz (69.9 kg)   BMI 27.32 kg/m  CONSTITUTIONAL: Well-developed, well-nourished female in no acute distress.  PSYCHIATRIC: Normal mood and affect. Normal behavior. Normal judgment and thought content. [place]: Alert and oriented to person, place, and time. Normal muscle tone coordination. No cranial nerve deficit noted. HENT:  Normocephalic, atraumatic, External right and left ear normal.  EYES: Conjunctivae and EOM are normal.No scleral icterus.  NECK: Normal range of motion, supple, no masses.  Normal thyroid.  SKIN: Skin is warm and dry. No rash noted. Not diaphoretic. No erythema. No pallor. CARDIOVASCULAR: Normal heart rate noted, regular rhythm, no murmur, rubs or gallops. RESPIRATORY: Clear to auscultation bilaterally. Effort and breath sounds normal, no problems with respiration noted. BREASTS: Symmetric in size. No masses, skin changes, nipple drainage, or lymphadenopathy. ABDOMEN: Soft, normal bowel sounds, no distention noted.  No tenderness, rebound or guarding.  BLADDER: Normal PELVIC: (No significant changes)  External Genitalia: Normal  BUS: Small urethral caruncle  Vagina: moderate vaginal atrophy, pale tissues, lack of rugae; 1 degree cystocele, mild-moderate rectocele  Cervix: normal, no lesions noted  Uterus: surgically absent  Adnexa: Normal, no  tenderness  RV: External Exam NormaI, No Rectal Masses and Normal Sphincter tone  MUSCULOSKELETAL: Normal range of motion. No tenderness.  No cyanosis, clubbing, or edema.  2+ distal pulses. LYMPHATIC: No Axillary, Supraclavicular, or Inguinal Adenopathy.    Assessment:   Annual gynecologic examination 57 y.o. Contraception: status post hysterectomy status post LSH Overweight  First-degree cystocele, asymptomatic Chronic constipation Mild to moderate rectocele Vaginal atrophy Plan:  Pap: due 2020 Mammogram: Ordered Stool Guaiac Testing:colonoscopy thru pcp Labs: vit d tsh lipid a1c fbs Routine preventative health maintenance measures emphasized: Discussed importance of Vit D, Calcium supplements and weight bearing exercises in the prevention of osteoporosis., Exercise/Diet/Weight control, Tobacco Warnings, Alcohol/Substance use risks and Stress Management  Estring 2 mg intravaginal every 3 months-trial Return in 1 year for annual exam  Joyice Faster, CMA  Lindsay Mars, MD  Note: This dictation was prepared with Dragon dictation along with smaller phrase technology. Any transcriptional errors that result from this process are unintentional.

## 2017-11-07 ENCOUNTER — Encounter: Payer: Self-pay | Admitting: Obstetrics and Gynecology

## 2017-11-07 ENCOUNTER — Ambulatory Visit (INDEPENDENT_AMBULATORY_CARE_PROVIDER_SITE_OTHER): Payer: BC Managed Care – PPO | Admitting: Obstetrics and Gynecology

## 2017-11-07 VITALS — BP 124/75 | HR 65 | Ht 63.0 in | Wt 154.2 lb

## 2017-11-07 DIAGNOSIS — Z1211 Encounter for screening for malignant neoplasm of colon: Secondary | ICD-10-CM

## 2017-11-07 DIAGNOSIS — Z01419 Encounter for gynecological examination (general) (routine) without abnormal findings: Secondary | ICD-10-CM | POA: Diagnosis not present

## 2017-11-07 DIAGNOSIS — Z1231 Encounter for screening mammogram for malignant neoplasm of breast: Secondary | ICD-10-CM | POA: Diagnosis not present

## 2017-11-07 DIAGNOSIS — Z90711 Acquired absence of uterus with remaining cervical stump: Secondary | ICD-10-CM

## 2017-11-07 DIAGNOSIS — Z780 Asymptomatic menopausal state: Secondary | ICD-10-CM | POA: Diagnosis not present

## 2017-11-07 DIAGNOSIS — N763 Subacute and chronic vulvitis: Secondary | ICD-10-CM | POA: Diagnosis not present

## 2017-11-07 DIAGNOSIS — N952 Postmenopausal atrophic vaginitis: Secondary | ICD-10-CM

## 2017-11-07 MED ORDER — ESTRADIOL 2 MG VA RING: 2.0000 mg | VAGINAL_RING | VAGINAL | 3 refills | Status: DC

## 2017-11-07 NOTE — Patient Instructions (Addendum)
1.  Pap smear is not done.  Next Pap smear is due in 2020. 2.  Mammogram is ordered. 3.  Stool guaiac card testing is not done.  Patient will obtain colonoscopy through primary care. 4.  Screening labs are ordered. 5.  Continue with healthy eating and exercise. 6.  Continue with calcium and vitamin D supplementation-1200 mg daily/800 international units a day. 7.  Return in 1 year for annual exam 8.  Estring 2 mg intravaginal every 3 months   Health Maintenance for Postmenopausal Women Menopause is a normal process in which your reproductive ability comes to an end. This process happens gradually over a span of months to years, usually between the ages of 57 and 93. Menopause is complete when you have missed 12 consecutive menstrual periods. It is important to talk with your health care provider about some of the most common conditions that affect postmenopausal women, such as heart disease, cancer, and bone loss (osteoporosis). Adopting a healthy lifestyle and getting preventive care can help to promote your health and wellness. Those actions can also lower your chances of developing some of these common conditions. What should I know about menopause? During menopause, you may experience a number of symptoms, such as:  Moderate-to-severe hot flashes.  Night sweats.  Decrease in sex drive.  Mood swings.  Headaches.  Tiredness.  Irritability.  Memory problems.  Insomnia.  Choosing to treat or not to treat menopausal changes is an individual decision that you make with your health care provider. What should I know about hormone replacement therapy and supplements? Hormone therapy products are effective for treating symptoms that are associated with menopause, such as hot flashes and night sweats. Hormone replacement carries certain risks, especially as you become older. If you are thinking about using estrogen or estrogen with progestin treatments, discuss the benefits and risks  with your health care provider. What should I know about heart disease and stroke? Heart disease, heart attack, and stroke become more likely as you age. This may be due, in part, to the hormonal changes that your body experiences during menopause. These can affect how your body processes dietary fats, triglycerides, and cholesterol. Heart attack and stroke are both medical emergencies. There are many things that you can do to help prevent heart disease and stroke:  Have your blood pressure checked at least every 1-2 years. High blood pressure causes heart disease and increases the risk of stroke.  If you are 89-66 years old, ask your health care provider if you should take aspirin to prevent a heart attack or a stroke.  Do not use any tobacco products, including cigarettes, chewing tobacco, or electronic cigarettes. If you need help quitting, ask your health care provider.  It is important to eat a healthy diet and maintain a healthy weight. ? Be sure to include plenty of vegetables, fruits, low-fat dairy products, and lean protein. ? Avoid eating foods that are high in solid fats, added sugars, or salt (sodium).  Get regular exercise. This is one of the most important things that you can do for your health. ? Try to exercise for at least 150 minutes each week. The type of exercise that you do should increase your heart rate and make you sweat. This is known as moderate-intensity exercise. ? Try to do strengthening exercises at least twice each week. Do these in addition to the moderate-intensity exercise.  Know your numbers.Ask your health care provider to check your cholesterol and your blood glucose. Continue to  to have your blood tested as directed by your health care provider.  What should I know about cancer screening? There are several types of cancer. Take the following steps to reduce your risk and to catch any cancer development as early as possible. Breast Cancer  Practice breast  self-awareness. ? This means understanding how your breasts normally appear and feel. ? It also means doing regular breast self-exams. Let your health care provider know about any changes, no matter how small.  If you are 40 or older, have a clinician do a breast exam (clinical breast exam or CBE) every year. Depending on your age, family history, and medical history, it may be recommended that you also have a yearly breast X-ray (mammogram).  If you have a family history of breast cancer, talk with your health care provider about genetic screening.  If you are at high risk for breast cancer, talk with your health care provider about having an MRI and a mammogram every year.  Breast cancer (BRCA) gene test is recommended for women who have family members with BRCA-related cancers. Results of the assessment will determine the need for genetic counseling and BRCA1 and for BRCA2 testing. BRCA-related cancers include these types: ? Breast. This occurs in males or females. ? Ovarian. ? Tubal. This may also be called fallopian tube cancer. ? Cancer of the abdominal or pelvic lining (peritoneal cancer). ? Prostate. ? Pancreatic.  Cervical, Uterine, and Ovarian Cancer Your health care provider may recommend that you be screened regularly for cancer of the pelvic organs. These include your ovaries, uterus, and vagina. This screening involves a pelvic exam, which includes checking for microscopic changes to the surface of your cervix (Pap test).  For women ages 21-65, health care providers may recommend a pelvic exam and a Pap test every three years. For women ages 30-65, they may recommend the Pap test and pelvic exam, combined with testing for human papilloma virus (HPV), every five years. Some types of HPV increase your risk of cervical cancer. Testing for HPV may also be done on women of any age who have unclear Pap test results.  Other health care providers may not recommend any screening for  nonpregnant women who are considered low risk for pelvic cancer and have no symptoms. Ask your health care provider if a screening pelvic exam is right for you.  If you have had past treatment for cervical cancer or a condition that could lead to cancer, you need Pap tests and screening for cancer for at least 20 years after your treatment. If Pap tests have been discontinued for you, your risk factors (such as having a new sexual partner) need to be reassessed to determine if you should start having screenings again. Some women have medical problems that increase the chance of getting cervical cancer. In these cases, your health care provider may recommend that you have screening and Pap tests more often.  If you have a family history of uterine cancer or ovarian cancer, talk with your health care provider about genetic screening.  If you have vaginal bleeding after reaching menopause, tell your health care provider.  There are currently no reliable tests available to screen for ovarian cancer.  Lung Cancer Lung cancer screening is recommended for adults 55-80 years old who are at high risk for lung cancer because of a history of smoking. A yearly low-dose CT scan of the lungs is recommended if you:  Currently smoke.  Have a history of at least 30   pack-years of smoking and you currently smoke or have quit within the past 15 years. A pack-year is smoking an average of one pack of cigarettes per day for one year.  Yearly screening should:  Continue until it has been 15 years since you quit.  Stop if you develop a health problem that would prevent you from having lung cancer treatment.  Colorectal Cancer  This type of cancer can be detected and can often be prevented.  Routine colorectal cancer screening usually begins at age 50 and continues through age 75.  If you have risk factors for colon cancer, your health care provider may recommend that you be screened at an earlier age.  If you  have a family history of colorectal cancer, talk with your health care provider about genetic screening.  Your health care provider may also recommend using home test kits to check for hidden blood in your stool.  A small camera at the end of a tube can be used to examine your colon directly (sigmoidoscopy or colonoscopy). This is done to check for the earliest forms of colorectal cancer.  Direct examination of the colon should be repeated every 5-10 years until age 75. However, if early forms of precancerous polyps or small growths are found or if you have a family history or genetic risk for colorectal cancer, you may need to be screened more often.  Skin Cancer  Check your skin from head to toe regularly.  Monitor any moles. Be sure to tell your health care provider: ? About any new moles or changes in moles, especially if there is a change in a mole's shape or color. ? If you have a mole that is larger than the size of a pencil eraser.  If any of your family members has a history of skin cancer, especially at a young age, talk with your health care provider about genetic screening.  Always use sunscreen. Apply sunscreen liberally and repeatedly throughout the day.  Whenever you are outside, protect yourself by wearing long sleeves, pants, a wide-brimmed hat, and sunglasses.  What should I know about osteoporosis? Osteoporosis is a condition in which bone destruction happens more quickly than new bone creation. After menopause, you may be at an increased risk for osteoporosis. To help prevent osteoporosis or the bone fractures that can happen because of osteoporosis, the following is recommended:  If you are 19-50 years old, get at least 1,000 mg of calcium and at least 600 mg of vitamin D per day.  If you are older than age 50 but younger than age 70, get at least 1,200 mg of calcium and at least 600 mg of vitamin D per day.  If you are older than age 70, get at least 1,200 mg of  calcium and at least 800 mg of vitamin D per day.  Smoking and excessive alcohol intake increase the risk of osteoporosis. Eat foods that are rich in calcium and vitamin D, and do weight-bearing exercises several times each week as directed by your health care provider. What should I know about how menopause affects my mental health? Depression may occur at any age, but it is more common as you become older. Common symptoms of depression include:  Low or sad mood.  Changes in sleep patterns.  Changes in appetite or eating patterns.  Feeling an overall lack of motivation or enjoyment of activities that you previously enjoyed.  Frequent crying spells.  Talk with your health care provider if you think that   are experiencing depression. What should I know about immunizations? It is important that you get and maintain your immunizations. These include:  Tetanus, diphtheria, and pertussis (Tdap) booster vaccine.  Influenza every year before the flu season begins.  Pneumonia vaccine.  Shingles vaccine.  Your health care provider may also recommend other immunizations. This information is not intended to replace advice given to you by your health care provider. Make sure you discuss any questions you have with your health care provider. Document Released: 07/29/2005 Document Revised: 12/25/2015 Document Reviewed: 03/10/2015 Elsevier Interactive Patient Education  2018 [employer].

## 2017-11-08 LAB — GLUCOSE, RANDOM: GLUCOSE: 72 mg/dL (ref 65–99)

## 2017-11-08 LAB — LIPID PANEL
CHOLESTEROL TOTAL: 183 mg/dL (ref 100–199)
Chol/HDL Ratio: 3 ratio (ref 0.0–4.4)
HDL: 62 mg/dL (ref >39)
LDL Calculated: 103 mg/dL — ABNORMAL HIGH (ref 0–99)
TRIGLYCERIDES: 91 mg/dL (ref 0–149)
VLDL Cholesterol Cal: 18 mg/dL (ref 5–40)

## 2017-11-08 LAB — HEMOGLOBIN A1C
ESTIMATED AVERAGE GLUCOSE: 103 mg/dL
HEMOGLOBIN A1C: 5.2 % (ref 4.8–5.6)

## 2017-11-08 LAB — TSH: TSH: 2.69 u[IU]/mL (ref 0.450–4.500)

## 2017-11-08 LAB — VITAMIN D 25 HYDROXY (VIT D DEFICIENCY, FRACTURES): Vit D, 25-Hydroxy: 43.1 ng/mL (ref 30.0–100.0)

## 2018-01-01 ENCOUNTER — Encounter: Payer: Self-pay | Admitting: Gastroenterology

## 2018-01-15 ENCOUNTER — Ambulatory Visit
Admission: RE | Admit: 2018-01-15 | Discharge: 2018-01-15 | Disposition: A | Discharge status: Home / Self Care | Payer: BC Managed Care – PPO | Source: Ambulatory Visit | Attending: Obstetrics and Gynecology | Admitting: Obstetrics and Gynecology

## 2018-01-15 DIAGNOSIS — Z1231 Encounter for screening mammogram for malignant neoplasm of breast: Secondary | ICD-10-CM | POA: Insufficient documentation

## 2018-01-22 ENCOUNTER — Ambulatory Visit (AMBULATORY_SURGERY_CENTER): Payer: Self-pay

## 2018-01-22 VITALS — Ht 63.0 in | Wt 153.2 lb

## 2018-01-22 DIAGNOSIS — Z86010 Personal history of colonic polyps: Secondary | ICD-10-CM

## 2018-01-22 MED ORDER — NA SULFATE-K SULFATE-MG SULF 17.5-3.13-1.6 GM/177ML PO SOLN: 1.0000 | Freq: Once | ORAL | 0 refills | Status: AC

## 2018-01-22 NOTE — Progress Notes (Signed)
Denies allergies to eggs or soy products. Denies complication of anesthesia or sedation. Denies use of weight loss medication. Denies use of O2.   Emmi instructions declined.  

## 2018-01-29 ENCOUNTER — Encounter: Payer: Self-pay | Admitting: Gastroenterology

## 2018-02-08 ENCOUNTER — Telehealth: Payer: Self-pay | Admitting: Gastroenterology

## 2018-02-08 NOTE — Telephone Encounter (Signed)
Will leave Suprep at 3rd floor front desk- Lot [phone]  Exp 7-21  As directed Lelan Pons

## 2018-02-08 NOTE — Telephone Encounter (Signed)
Patient states prep is too expensive and wants to see if there is a cheaper prep that can be called in. Patients colon is this Monday 8.26.19.

## 2018-02-08 NOTE — Telephone Encounter (Signed)
Attempted pt- no answer again- [facility]  Lelan Pons

## 2018-02-08 NOTE — Telephone Encounter (Signed)
Patient returning Lindsay Lane;s call. Patient states prep was around $107 and she can not afford that. Patient states she will try to get by this afternoon to pick up sample.

## 2018-02-08 NOTE — Telephone Encounter (Signed)
Called pt- no answer- Lm to return  My call about her Prep =Lindsay Lane

## 2018-02-12 ENCOUNTER — Encounter: Payer: Self-pay | Admitting: Gastroenterology

## 2018-02-12 ENCOUNTER — Ambulatory Visit (AMBULATORY_SURGERY_CENTER): Payer: BC Managed Care – PPO | Admitting: Gastroenterology

## 2018-02-12 VITALS — BP 147/93 | HR 69 | Temp 98.4°F | Resp 14 | Ht 63.0 in | Wt 153.0 lb

## 2018-02-12 DIAGNOSIS — Z86010 Personal history of colonic polyps: Secondary | ICD-10-CM | POA: Diagnosis present

## 2018-02-12 DIAGNOSIS — D122 Benign neoplasm of ascending colon: Secondary | ICD-10-CM | POA: Diagnosis not present

## 2018-02-12 DIAGNOSIS — D123 Benign neoplasm of transverse colon: Secondary | ICD-10-CM

## 2018-02-12 MED ORDER — SODIUM CHLORIDE 0.9 % IV SOLN: 500.0000 mL | Freq: Once | INTRAVENOUS | Status: DC

## 2018-02-12 NOTE — Progress Notes (Signed)
Pt's states no medical or surgical changes since previsit or office visit. 

## 2018-02-12 NOTE — Patient Instructions (Signed)
Handouts: Polyps  YOU HAD AN ENDOSCOPIC PROCEDURE TODAY AT THE [facility]:   Refer to the procedure report that was given to you for any specific questions about what was found during the examination.  If the procedure report does not answer your questions, please call your gastroenterologist to clarify.  If you requested that your care partner not be given the details of your procedure findings, then the procedure report has been included in a sealed envelope for you to review at your convenience later.  YOU SHOULD EXPECT: Some feelings of bloating in the abdomen. Passage of more gas than usual.  Walking can help get rid of the air that was put into your GI tract during the procedure and reduce the bloating. If you had a lower endoscopy (such as a colonoscopy or flexible sigmoidoscopy) you may notice spotting of blood in your stool or on the toilet paper. If you underwent a bowel prep for your procedure, you may not have a normal bowel movement for a few days.  Please Note:  You might notice some irritation and congestion in your nose or some drainage.  This is from the oxygen used during your procedure.  There is no need for concern and it should clear up in a day or so.  SYMPTOMS TO REPORT IMMEDIATELY:   Following lower endoscopy (colonoscopy or flexible sigmoidoscopy):  Excessive amounts of blood in the stool  Significant tenderness or worsening of abdominal pains  Swelling of the abdomen that is new, acute  Fever of 100F or higher  For urgent or emergent issues, a gastroenterologist can be reached at any hour by calling [phone].   DIET:  We do recommend a small meal at first, but then you may proceed to your regular diet.  Drink plenty of fluids but you should avoid alcoholic beverages for 24 hours.  ACTIVITY:  You should plan to take it easy for the rest of today and you should NOT DRIVE or use heavy machinery until tomorrow (because of the sedation medicines used  during the test).    FOLLOW UP: Our staff will call the number listed on your records the next business day following your procedure to check on you and address any questions or concerns that you may have regarding the information given to you following your procedure. If we do not reach you, we will leave a message.  However, if you are feeling well and you are not experiencing any problems, there is no need to return our call.  We will assume that you have returned to your regular daily activities without incident.  If any biopsies were taken you will be contacted by phone or by letter within the next 1-3 weeks.  Please call us at [phone] if you have not heard about the biopsies in 3 weeks.    SIGNATURES/CONFIDENTIALITY: You and/or your care partner have signed paperwork which will be entered into your electronic medical record.  These signatures attest to the fact that that the information above on your After Visit Summary has been reviewed and is understood.  Full responsibility of the confidentiality of this discharge information lies with you and/or your care-partner. 

## 2018-02-12 NOTE — Progress Notes (Signed)
A/ox3 pleased with MAC, report to RN 

## 2018-02-12 NOTE — Progress Notes (Signed)
Pt has her albuterol inhaler at the bed side with lable. maw

## 2018-02-12 NOTE — Op Note (Signed)
[place] [facility] Patient Name: Lindsay Lane Procedure Date: 02/12/2018 2:54 PM MRN: [id number] Endoscopist: Mauri Pole , MD Age: 57 Referring MD:  Date of Birth: May 11, 1961 Gender: Female Account #: [id number] Procedure:                Colonoscopy Indications:              High risk colon cancer surveillance: Personal                            history of colonic polyps Medicines:                Monitored Anesthesia Care Procedure:                Pre-Anesthesia Assessment:                           - Prior to the procedure, a History and Physical                            was performed, and patient medications and                            allergies were reviewed. The patient's tolerance of                            previous anesthesia was also reviewed. The risks                            and benefits of the procedure and the sedation                            options and risks were discussed with the patient.                            All questions were answered, and informed consent                            was obtained. Prior Anticoagulants: The patient has                            taken no previous anticoagulant or antiplatelet                            agents. ASA Grade Assessment: II - A patient with                            mild systemic disease. After reviewing the risks                            and benefits, the patient was deemed in                            satisfactory condition to undergo the procedure.  After obtaining informed consent, the colonoscope                            was passed under direct vision. Throughout the                            procedure, the patient's blood pressure, pulse, and                            oxygen saturations were monitored continuously. The                            Model PCF-H190DL [id number]) scope was introduced                            through the anus and advanced to  the the cecum,                            identified by appendiceal orifice and ileocecal                            valve. The colonoscopy was performed without                            difficulty. The patient tolerated the procedure                            well. The quality of the bowel preparation was                            good. The ileocecal valve, appendiceal orifice, and                            rectum were photographed. Scope In: 3:05:00 PM Scope Out: 3:26:55 PM Scope Withdrawal Time: 0 hours 18 minutes 21 seconds  Total Procedure Duration: 0 hours 21 minutes 55 seconds  Findings:                 The perianal and digital rectal examinations were                            normal.                           Two sessile polyps were found in the ascending                            colon. The polyps were 4 to 6 mm in size. These                            polyps were removed with a cold snare. Resection                            and retrieval were complete.  Three sessile polyps were found in the ascending                            colon. The polyps were 1 to 2 mm in size. These                            polyps were removed with a cold biopsy forceps.                            Resection and retrieval were complete.                           Non-bleeding internal hemorrhoids were found during                            retroflexion. The hemorrhoids were small.                           Scattered small and large-mouthed diverticula were                            found in the sigmoid colon and descending colon. Complications:            No immediate complications. Estimated Blood Loss:     Estimated blood loss was minimal. Impression:               - Two 4 to 6 mm polyps in the ascending colon,                            removed with a cold snare. Resected and retrieved.                           - Three 1 to 2 mm polyps in the ascending colon,                             removed with a cold biopsy forceps. Resected and                            retrieved.                           - Non-bleeding internal hemorrhoids.                           - Diverticulosis in the sigmoid colon and in the                            descending colon. Recommendation:           - Patient has a contact number available for                            emergencies. The signs and symptoms of potential  delayed complications were discussed with the                            patient. Return to normal activities tomorrow.                            Written discharge instructions were provided to the                            patient.                           - Resume previous diet.                           - Continue present medications.                           - Await pathology results.                           - Repeat colonoscopy in 3 years for surveillance                            based on pathology results. Mauri Pole, MD 02/12/2018 3:33:34 PM This report has been signed electronically.

## 2018-02-12 NOTE — Progress Notes (Signed)
Called to room to assist during endoscopic procedure.  Patient ID and intended procedure confirmed with present staff. Received instructions for my participation in the procedure from the performing physician.  

## 2018-02-13 ENCOUNTER — Encounter: Payer: Self-pay | Admitting: Gastroenterology

## 2018-02-13 ENCOUNTER — Telehealth: Payer: Self-pay | Admitting: *Deleted

## 2018-02-13 NOTE — Telephone Encounter (Signed)
  Follow up Call-  Call back number 02/12/2018  Post procedure Call Back phone  # [phone] cell  Permission to leave phone message Yes  Some recent data might be hidden     Patient questions:  Do you have a fever, pain , or abdominal swelling? No. Pain Score  0 *  Have you tolerated food without any problems? Yes.    Have you been able to return to your normal activities? Yes.    Do you have any questions about your discharge instructions: Diet   No. Medications  No. Follow up visit  No.  Do you have questions or concerns about your Care? No.  Actions: * If pain score is 4 or above: No action needed, pain <4.

## 2018-02-22 ENCOUNTER — Encounter: Payer: Self-pay | Admitting: Gastroenterology

## 2018-03-19 ENCOUNTER — Ambulatory Visit: Payer: Self-pay

## 2018-03-19 NOTE — Telephone Encounter (Signed)
Pt. Reports she started having abdominal pain Friday. Lower left abd. It was at its worse Friday. Hurts with movement and if she touches the area. No fever,vomiting or diarrhea. Normal bowel movements. Eating and drinking at her normal. No availability today. Appointment made for tomorrow morning. Instructed her if she has increased pain or other symptoms, to go to [facility] or ED. Verbalizes understanding. Reason for Disposition . [1] MODERATE pain (e.g., interferes with normal activities) AND [2] pain comes and goes (cramps) AND [3] present > 24 hours  (Exception: pain with Vomiting or Diarrhea - see that Guideline)  Answer Assessment - Initial Assessment Questions 1. LOCATION: "Where does it hurt?"      Lower left abd. 2. RADIATION: "Does the pain shoot anywhere else?" (e.g., chest, back)     Back hurts as well 3. ONSET: "When did the pain begin?" (e.g., minutes, hours or days ago)      Friday 4. SUDDEN: "Gradual or sudden onset?"     Gradual 5. PATTERN "Does the pain come and go, or is it constant?"    - If constant: "Is it getting better, staying the same, or worsening?"      (Note: Constant means the pain never goes away completely; most serious pain is constant and it progresses)     - If intermittent: "How long does it last?" "Do you have pain now?"     (Note: Intermittent means the pain goes away completely between bouts)     Touching hurts - worse with movement 6. SEVERITY: "How bad is the pain?"  (e.g., Scale 1-10; mild, moderate, or severe)   - MILD (1-3): doesn't interfere with normal activities, abdomen soft and not tender to touch    - MODERATE (4-7): interferes with normal activities or awakens from sleep, tender to touch    - SEVERE (8-10): excruciating pain, doubled over, unable to do any normal activities      This morning 3-47. RECURRENT SYMPTOM: "Have you ever had this type of abdominal pain before?" If so, ask: "When was the last time?" and "What happened that time?"   No 8. CAUSE: "What do you think is causing the abdominal pain?"     Unsure 9. RELIEVING/AGGRAVATING FACTORS: "What makes it better or worse?" (e.g., movement, antacids, bowel movement)     Touching that area hurts 10. OTHER SYMPTOMS: "Has there been any vomiting, diarrhea, constipation, or urine problems?"       No 11. PREGNANCY: "Is there any chance you are pregnant?" "When was your last menstrual period?"       No  Protocols used: ABDOMINAL PAIN - [facility]

## 2018-03-20 ENCOUNTER — Ambulatory Visit
Admission: RE | Admit: 2018-03-20 | Discharge: 2018-03-20 | Disposition: A | Discharge status: Home / Self Care | Payer: BC Managed Care – PPO | Source: Ambulatory Visit | Attending: Family Medicine | Admitting: Family Medicine

## 2018-03-20 ENCOUNTER — Ambulatory Visit: Payer: BC Managed Care – PPO | Admitting: Family Medicine

## 2018-03-20 ENCOUNTER — Telehealth: Payer: Self-pay | Admitting: Family Medicine

## 2018-03-20 ENCOUNTER — Encounter: Payer: Self-pay | Admitting: Family Medicine

## 2018-03-20 VITALS — BP 143/83 | HR 62 | Temp 97.8°F | Wt 155.7 lb

## 2018-03-20 DIAGNOSIS — R1032 Left lower quadrant pain: Secondary | ICD-10-CM | POA: Insufficient documentation

## 2018-03-20 DIAGNOSIS — K5732 Diverticulitis of large intestine without perforation or abscess without bleeding: Secondary | ICD-10-CM | POA: Insufficient documentation

## 2018-03-20 DIAGNOSIS — Z23 Encounter for immunization: Secondary | ICD-10-CM

## 2018-03-20 DIAGNOSIS — R918 Other nonspecific abnormal finding of lung field: Secondary | ICD-10-CM | POA: Diagnosis not present

## 2018-03-20 DIAGNOSIS — Z981 Arthrodesis status: Secondary | ICD-10-CM | POA: Diagnosis not present

## 2018-03-20 DIAGNOSIS — K7689 Other specified diseases of liver: Secondary | ICD-10-CM | POA: Diagnosis not present

## 2018-03-20 DIAGNOSIS — R350 Frequency of micturition: Secondary | ICD-10-CM | POA: Diagnosis not present

## 2018-03-20 LAB — UA/M W/RFLX CULTURE, ROUTINE
Bilirubin, UA: NEGATIVE
Glucose, UA: NEGATIVE
KETONES UA: NEGATIVE
LEUKOCYTES UA: NEGATIVE
NITRITE UA: NEGATIVE
Protein, UA: NEGATIVE
RBC, UA: NEGATIVE
SPEC GRAV UA: 1.015 (ref 1.005–1.030)
UUROB: 0.2 mg/dL (ref 0.2–1.0)
pH, UA: 7.5 (ref 5.0–7.5)

## 2018-03-20 MED ORDER — METRONIDAZOLE 500 MG PO TABS: 500.0000 mg | ORAL_TABLET | Freq: Two times a day (BID) | ORAL | 0 refills | Status: DC

## 2018-03-20 MED ORDER — CIPROFLOXACIN HCL 500 MG PO TABS: 500.0000 mg | ORAL_TABLET | Freq: Two times a day (BID) | ORAL | 0 refills | Status: DC

## 2018-03-20 MED ORDER — IOPAMIDOL (ISOVUE-300) INJECTION 61%
100.0000 mL | Freq: Once | INTRAVENOUS | Status: AC | PRN
  Administered 2018-03-20: 100 mL via INTRAVENOUS

## 2018-03-20 NOTE — Telephone Encounter (Signed)
Called and spoke to nurse reviewer, Jenny Reichmann and further clinical information was given. After completing clinical questions I was informed by Jenny Reichmann that case would now be approved if imaging had not been done at out of network facility and that it must be escalated to the medical director.

## 2018-03-20 NOTE — Progress Notes (Signed)
BP (!) 143/83   Pulse 62   Temp 97.8 F (36.6 C) (Oral)   Wt 155 lb 11.2 oz (70.6 kg)   SpO2 95%   BMI 27.58 kg/m    Subjective:    Patient ID: Lindsay Lane, female    DOB: October 01, 1960, 57 y.o.   MRN: [id number]  HPI: Lindsay Lane is a 57 y.o. female  Chief Complaint  Patient presents with  . Urinary Frequency  . Abdominal Pain    Lower left abdominal. Started Firday.    ABDOMINAL PAIN  Duration:SInce Friday- has been getting better.  Onset: sudden Severity: severe Quality: sharp pain if she touches it, otherwise dull ache Location:  LLQ  Episode duration: only with touching it Radiation: no Frequency: with pressure  Alleviating factors: eating Aggravating factors: touching it Status: better Treatments attempted: none Fever: no Nausea: no Vomiting: no Weight loss: no Decreased appetite: no Diarrhea: no Constipation: yes Blood in stool: no Heartburn: no Jaundice: no Rash: no Dysuria: no Urinary frequency: yes Hematuria: no History of sexually transmitted disease: no Recurrent NSAID use: yes  Relevant past medical, surgical, family and social history reviewed and updated as indicated. Interim medical history since our last visit reviewed. Allergies and medications reviewed and updated.  Review of Systems  Constitutional: Negative.   Respiratory: Negative.   Cardiovascular: Negative.   Gastrointestinal: Positive for abdominal distention, abdominal pain and constipation. Negative for anal bleeding, blood in stool, diarrhea, nausea, rectal pain and vomiting.  Musculoskeletal: Negative.   Skin: Negative.   Neurological: Negative.   Psychiatric/Behavioral: Negative.     Per HPI unless specifically indicated above     Objective:    BP (!) 143/83   Pulse 62   Temp 97.8 F (36.6 C) (Oral)   Wt 155 lb 11.2 oz (70.6 kg)   SpO2 95%   BMI 27.58 kg/m   Wt Readings from Last 3 Encounters:  03/20/18 155 lb 11.2 oz (70.6 kg)  02/12/18 153 lb (69.4  kg)  01/22/18 153 lb 3.2 oz (69.5 kg)    Physical Exam  Constitutional: She is oriented to person, place, and time. She appears well-developed and well-nourished. No distress.  HENT:  Head: Normocephalic and atraumatic.  Right Ear: Hearing normal.  Left Ear: Hearing normal.  Nose: Nose normal.  Eyes: Conjunctivae and lids are normal. Right eye exhibits no discharge. Left eye exhibits no discharge. No scleral icterus.  Cardiovascular: Normal rate, regular rhythm, normal heart sounds and intact distal pulses. Exam reveals no gallop and no friction rub.  No murmur heard. Pulmonary/Chest: Effort normal and breath sounds normal. No stridor. No respiratory distress. She has no wheezes. She has no rhonchi. She has no rales. She exhibits no tenderness.  Abdominal: Soft. Normal appearance. There is tenderness in the right upper quadrant, right lower quadrant and left lower quadrant. There is rebound and guarding.  Musculoskeletal: Normal range of motion.  Neurological: She is alert and oriented to person, place, and time.  Skin: Skin is warm, dry and intact. No rash noted. She is not diaphoretic. No cyanosis or erythema. No pallor.  Psychiatric: She has a normal mood and affect. Her speech is normal and behavior is normal. Judgment and thought content normal. Cognition and memory are normal.  Nursing note and vitals reviewed.   Results for orders placed or performed in visit on 11/07/17  Glucose, random  Result Value Ref Range   Glucose 72 65 - 99 mg/dL  Hemoglobin A1c  Result Value  Ref Range   Hgb A1c MFr Bld 5.2 4.8 - 5.6 %   Est. average glucose Bld gHb Est-mCnc 103 mg/dL  Lipid panel  Result Value Ref Range   Cholesterol, Total 183 100 - 199 mg/dL   Triglycerides 91 0 - 149 mg/dL   HDL 62 >39 mg/dL   VLDL Cholesterol Cal 18 5 - 40 mg/dL   LDL Calculated 103 (H) 0 - 99 mg/dL   Chol/HDL Ratio 3.0 0.0 - 4.4 ratio  TSH  Result Value Ref Range   TSH 2.690 0.450 - 4.500 uIU/mL  VITAMIN  D 25 Hydroxy (Vit-D Deficiency, Fractures)  Result Value Ref Range   Vit D, 25-Hydroxy 43.1 30.0 - 100.0 ng/mL      Assessment & Plan:   Problem List Items Addressed This Visit    None    Visit Diagnoses    LLQ pain    -  Primary   Significant concern for diverticulitis. Will obtain STAT CT abdomen pelvis. Await results. Call with any concerns.    Relevant Orders   CBC With Differential/Platelet   Comprehensive metabolic panel   Urinary frequency       UA and CBC normal. Will obtain CT. Await results.    Relevant Orders   UA/M w/rflx Culture, Routine   Needs flu shot       Flu shot given today.   Relevant Orders   Flu Vaccine QUAD 6+ mos PF IM (Fluarix Quad PF) (Completed)   Left lower quadrant abdominal pain       Relevant Orders   CT Abdomen Pelvis W Contrast       Follow up plan: Return if symptoms worsen or fail to improve.

## 2018-03-20 NOTE — Telephone Encounter (Signed)
Copied from [place] [id number]. Topic: Referral - Status >> Mar 20, 2018  2:44 PM Robina Ade, Helene Kelp D wrote: Reason for CRM: I called [employer] to get approval for service and it was denied due to no medical necessity. I told them what was wrong with the patient. They asked for Dr. Wynetta Emery to call them [phone] with the member ID # [id number] for more information. When it is approve please let me know and give me the approval number because I need to call the pre service center with that information. [phone].

## 2018-03-20 NOTE — Patient Instructions (Addendum)
Please go to the Imaging center and collect your contrast, They will explain when you need to take it. Your imaging appointment is set for 03/20/18 @ 1 p.m. Arrival @ 12:45 [facility] [address] [place],  [place]  [id number]   Influenza (Flu) Vaccine (Inactivated or Recombinant): What You Need to Know 1. Why get vaccinated? Influenza ("flu") is a contagious disease that spreads around the [place] every year, usually between October and May. Flu is caused by influenza viruses, and is spread mainly by coughing, sneezing, and close contact. Anyone can get flu. Flu strikes suddenly and can last several days. Symptoms vary by age, but can include:  fever/chills  sore throat  muscle aches  fatigue  cough  headache  runny or stuffy nose  Flu can also lead to pneumonia and blood infections, and cause diarrhea and seizures in children. If you have a medical condition, such as heart or lung disease, flu can make it worse. Flu is more dangerous for some people. Infants and young children, people 31 years of age and older, pregnant women, and people with certain health conditions or a weakened immune system are at greatest risk. Each year thousands of people in the [place] die from flu, and many more are hospitalized. Flu vaccine can:  keep you from getting flu,  make flu less severe if you do get it, and  keep you from spreading flu to your family and other people. 2. Inactivated and recombinant flu vaccines A dose of flu vaccine is recommended every flu season. Children 6 months through 72 years of age may need two doses during the same flu season. Everyone else needs only one dose each flu season. Some inactivated flu vaccines contain a very small amount of a mercury-based preservative called thimerosal. Studies have not shown thimerosal in vaccines to be harmful, but flu vaccines that do not contain thimerosal are  available. There is no live flu virus in flu shots. They cannot cause the flu. There are many flu viruses, and they are always changing. Each year a new flu vaccine is made to protect against three or four viruses that are likely to cause disease in the upcoming flu season. But even when the vaccine doesn't exactly match these viruses, it may still provide some protection. Flu vaccine cannot prevent:  flu that is caused by a virus not covered by the vaccine, or  illnesses that look like flu but are not.  It takes about 2 weeks for protection to develop after vaccination, and protection lasts through the flu season. 3. Some people should not get this vaccine Tell the person who is giving you the vaccine:  If you have any severe, life-threatening allergies. If you ever had a life-threatening allergic reaction after a dose of flu vaccine, or have a severe allergy to any part of this vaccine, you may be advised not to get vaccinated. Most, but not all, types of flu vaccine contain a small amount of egg protein.  If you ever had Guillain-Barr Syndrome (also called GBS). Some people with a history of GBS should not get this vaccine. This should be discussed with your doctor.  If you are not feeling well. It is usually okay to get flu vaccine when you have a mild illness, but you might be asked to come back when you feel better.  4. Risks of a vaccine reaction With any medicine, including vaccines, there is a chance of reactions. These  are usually mild and go away on their own, but serious reactions are also possible. Most people who get a flu shot do not have any problems with it. Minor problems following a flu shot include:  soreness, redness, or swelling where the shot was given  hoarseness  sore, red or itchy eyes  cough  fever  aches  headache  itching  fatigue  If these problems occur, they usually begin soon after the shot and last 1 or 2 days. More serious problems  following a flu shot can include the following:  There may be a small increased risk of Guillain-Barre Syndrome (GBS) after inactivated flu vaccine. This risk has been estimated at 1 or 2 additional cases per million people vaccinated. This is much lower than the risk of severe complications from flu, which can be prevented by flu vaccine.  Young children who get the flu shot along with pneumococcal vaccine (PCV13) and/or DTaP vaccine at the same time might be slightly more likely to have a seizure caused by fever. Ask your doctor for more information. Tell your doctor if a child who is getting flu vaccine has ever had a seizure.  Problems that could happen after any injected vaccine:  People sometimes faint after a medical procedure, including vaccination. Sitting or lying down for about 15 minutes can help prevent fainting, and injuries caused by a fall. Tell your doctor if you feel dizzy, or have vision changes or ringing in the ears.  Some people get severe pain in the shoulder and have difficulty moving the arm where a shot was given. This happens very rarely.  Any medication can cause a severe allergic reaction. Such reactions from a vaccine are very rare, estimated at about 1 in a million doses, and would happen within a few minutes to a few hours after the vaccination. As with any medicine, there is a very remote chance of a vaccine causing a serious injury or death. The safety of vaccines is always being monitored. For more information, visit: http://www.aguilar.org/ 5. What if there is a serious reaction? What should I look for? Look for anything that concerns you, such as signs of a severe allergic reaction, very high fever, or unusual behavior. Signs of a severe allergic reaction can include hives, swelling of the face and throat, difficulty breathing, a fast heartbeat, dizziness, and weakness. These would start a few minutes to a few hours after the vaccination. What should I  do?  If you think it is a severe allergic reaction or other emergency that can't wait, call 9-1-1 and get the person to the nearest hospital. Otherwise, call your doctor.  Reactions should be reported to the Vaccine Adverse Event Reporting System (VAERS). Your doctor should file this report, or you can do it yourself through the VAERS web site at www.vaers.SamedayNews.es, or by calling 2400094256. ? VAERS does not give medical advice. 6. The National Vaccine Injury Compensation Program The [employer] Vaccine Injury Compensation Program (VICP) is a federal program that was created to compensate people who may have been injured by certain vaccines. Persons who believe they may have been injured by a vaccine can learn about the program and about filing a claim by calling (430)039-1292 or visiting the [employer] website at GoldCloset.com.ee. There is a time limit to file a claim for compensation. 7. How can I learn more?  Ask your healthcare provider. He or she can give you the vaccine package insert or suggest other sources of information.  Call your local  or state health department.  Contact the Centers for Disease Control and Prevention (CDC): ? Call (919)461-4477 (1-800-CDC-INFO) or ? Visit CDC's website at https://gibson.com/ Vaccine Information Statement, Inactivated Influenza Vaccine (01/24/2014) This information is not intended to replace advice given to you by your health care provider. Make sure you discuss any questions you have with your health care provider. Document Released: 03/31/2006 Document Revised: 02/25/2016 Document Reviewed: 02/25/2016 Elsevier Interactive Patient Education  2017 [employer].

## 2018-03-20 NOTE — Telephone Encounter (Signed)
Patient notified, appointment scheduled

## 2018-03-20 NOTE — Telephone Encounter (Signed)
Let patient know that she has diverticulitis. Will start cipro and flagyl and recheck on Friday/Monday. Call with any concerns.

## 2018-03-21 LAB — COMPREHENSIVE METABOLIC PANEL
A/G RATIO: 2 (ref 1.2–2.2)
ALT: 16 IU/L (ref 0–32)
AST: 21 IU/L (ref 0–40)
Albumin: 4.1 g/dL (ref 3.5–5.5)
Alkaline Phosphatase: 97 IU/L (ref 39–117)
BILIRUBIN TOTAL: 0.3 mg/dL (ref 0.0–1.2)
BUN/Creatinine Ratio: 15 (ref 9–23)
BUN: 13 mg/dL (ref 6–24)
CALCIUM: 9.3 mg/dL (ref 8.7–10.2)
CHLORIDE: 103 mmol/L (ref 96–106)
CO2: 25 mmol/L (ref 20–29)
Creatinine, Ser: 0.84 mg/dL (ref 0.57–1.00)
GFR calc Af Amer: 89 mL/min/{1.73_m2} (ref >59)
GFR, EST NON AFRICAN AMERICAN: 77 mL/min/{1.73_m2} (ref >59)
GLOBULIN, TOTAL: 2.1 g/dL (ref 1.5–4.5)
Glucose: 77 mg/dL (ref 65–99)
POTASSIUM: 4.2 mmol/L (ref 3.5–5.2)
Sodium: 144 mmol/L (ref 134–144)
Total Protein: 6.2 g/dL (ref 6.0–8.5)

## 2018-03-21 LAB — SPECIMEN STATUS REPORT

## 2018-03-22 NOTE — Telephone Encounter (Signed)
Imaging was approved. Will have to call to get approval number soon.

## 2018-03-23 ENCOUNTER — Encounter: Payer: Self-pay | Admitting: Unknown Physician Specialty

## 2018-03-23 ENCOUNTER — Ambulatory Visit: Payer: BC Managed Care – PPO | Admitting: Unknown Physician Specialty

## 2018-03-23 DIAGNOSIS — M47816 Spondylosis without myelopathy or radiculopathy, lumbar region: Secondary | ICD-10-CM | POA: Diagnosis not present

## 2018-03-23 DIAGNOSIS — N289 Disorder of kidney and ureter, unspecified: Secondary | ICD-10-CM | POA: Diagnosis not present

## 2018-03-23 DIAGNOSIS — K5792 Diverticulitis of intestine, part unspecified, without perforation or abscess without bleeding: Secondary | ICD-10-CM | POA: Diagnosis not present

## 2018-03-23 DIAGNOSIS — K7689 Other specified diseases of liver: Secondary | ICD-10-CM

## 2018-03-23 NOTE — Assessment & Plan Note (Signed)
Too small to characterize on CT dated 10/1.  Discussed f/u in 6 months to 1 year.  Refusing for now

## 2018-03-23 NOTE — Progress Notes (Signed)
BP 137/81   Pulse 61   Temp 98.4 F (36.9 C) (Oral)   Ht 5\' 3"  (1.6 m)   Wt 160 lb 1.6 oz (72.6 kg)   SpO2 95%   BMI 28.36 kg/m    Subjective:    Patient ID: Lindsay Lane, female    DOB: 1961-06-11, 57 y.o.   MRN: [id number]  HPI: Lindsay Lane is a 57 y.o. female  Chief Complaint  Patient presents with  . Abdominal Pain    4 day f/up of LLQ pain- pt states she is feeling better   Abdominal pain Pt seen 4 days ago for diverticulitis.  CT confirmed and taking Cipro and Flagyl.  shw is much better today.  However, CT has multiple incidental findings  Lower lungs: Airway thickening.  Low dose CT and Spirometry were WNL  Liver cyst: 1.2 by 1.1 cm lesion.  Most likely a cyst.    Back arthropathy - Concerned about increased OA above and below a previous surgery.    Relevant past medical, surgical, family and social history reviewed and updated as indicated. Interim medical history since our last visit reviewed. Allergies and medications reviewed and updated.  Review of Systems  Per HPI unless specifically indicated above     Objective:    BP 137/81   Pulse 61   Temp 98.4 F (36.9 C) (Oral)   Ht 5\' 3"  (1.6 m)   Wt 160 lb 1.6 oz (72.6 kg)   SpO2 95%   BMI 28.36 kg/m   Wt Readings from Last 3 Encounters:  03/23/18 160 lb 1.6 oz (72.6 kg)  03/20/18 155 lb 11.2 oz (70.6 kg)  02/12/18 153 lb (69.4 kg)    Physical Exam  Constitutional: She is oriented to person, place, and time. She appears well-developed and well-nourished. No distress.  HENT:  Head: Normocephalic and atraumatic.  Eyes: Conjunctivae and lids are normal. Right eye exhibits no discharge. Left eye exhibits no discharge. No scleral icterus.  Neck: Normal range of motion. Neck supple. No JVD present. Carotid bruit is not present.  Cardiovascular: Normal rate, regular rhythm and normal heart sounds.  Abdominal: Normal appearance and bowel sounds are normal. There is no splenomegaly or hepatomegaly.  There is no tenderness.  Musculoskeletal: Normal range of motion.  Neurological: She is alert and oriented to person, place, and time.  Skin: Skin is warm, dry and intact. No rash noted. No pallor.  Psychiatric: She has a normal mood and affect. Her behavior is normal. Judgment and thought content normal.    Results for orders placed or performed in visit on 03/20/18  UA/M w/rflx Culture, Routine  Result Value Ref Range   Specific Gravity, UA 1.015 1.005 - 1.030   pH, UA 7.5 5.0 - 7.5   Color, UA Yellow Yellow   Appearance Ur Cloudy (A) Clear   Leukocytes, UA Negative Negative   Protein, UA Negative Negative/Trace   Glucose, UA Negative Negative   Ketones, UA Negative Negative   RBC, UA Negative Negative   Bilirubin, UA Negative Negative   Urobilinogen, Ur 0.2 0.2 - 1.0 mg/dL   Nitrite, UA Negative Negative  CBC With Differential/Platelet  Result Value Ref Range   WBC 6.3 3.4 - 10.8 x10E3/uL   RBC 4.59 3.77 - 5.28 x10E6/uL   Hemoglobin 14.8 11.1 - 15.9 g/dL   Hematocrit 42.6 34.0 - 46.6 %   MCV 93 79 - 97 fL   MCH 32.2 26.6 - 33.0 pg   MCHC  34.7 31.5 - 35.7 g/dL   RDW 13.4 12.3 - 15.4 %   Platelets 305 150 - 450 x10E3/uL   Neutrophils 61 Not Estab. %   Lymphs 29 Not Estab. %   MID 11 Not Estab. %   Neutrophils Absolute 3.8 1.4 - 7.0 x10E3/uL   Lymphocytes Absolute 1.8 0.7 - 3.1 x10E3/uL   MID (Absolute) 0.7 0.1 - 1.6 X10E3/uL  Comprehensive metabolic panel  Result Value Ref Range   Glucose 77 65 - 99 mg/dL   BUN 13 6 - 24 mg/dL   Creatinine, Ser 0.84 0.57 - 1.00 mg/dL   GFR calc non Af Amer 77 >59 mL/min/1.73   GFR calc Af Amer 89 >59 mL/min/1.73   BUN/Creatinine Ratio 15 9 - 23   Sodium 144 134 - 144 mmol/L   Potassium 4.2 3.5 - 5.2 mmol/L   Chloride 103 96 - 106 mmol/L   CO2 25 20 - 29 mmol/L   Calcium 9.3 8.7 - 10.2 mg/dL   Total Protein 6.2 6.0 - 8.5 g/dL   Albumin 4.1 3.5 - 5.5 g/dL   Globulin, Total 2.1 1.5 - 4.5 g/dL   Albumin/Globulin Ratio 2.0 1.2 - 2.2    Bilirubin Total 0.3 0.0 - 1.2 mg/dL   Alkaline Phosphatase 97 39 - 117 IU/L   AST 21 0 - 40 IU/L   ALT 16 0 - 32 IU/L  Specimen status report  Result Value Ref Range   specimen status report Comment       Assessment & Plan:   Problem List Items Addressed This Visit      Unprioritized   Diverticulitis    Discussed high fiber diet and Metamucil      Kidney lesion    Too small to characterize on CT dated 10/1.  Discussed f/u in 6 months to 1 year.  Refusing for now      Liver cyst    On CT 10/1 which was 1.4 to 1.1 cm.  Probable cyst.  Offered CT 6 months to a year.  Pt refusing and I'm comfortable with watchful waiting and following liver enzymes.  Normal on 10/1      Lumbar arthropathy    Discussed staying active and yoga.  History of ruptured disc          Follow up plan: Return in about 6 months (around 09/22/2018), or if symptoms worsen or fail to improve.

## 2018-03-23 NOTE — Telephone Encounter (Signed)
Authorization number: [id number]

## 2018-03-23 NOTE — Assessment & Plan Note (Signed)
Discussed staying active and yoga.  History of ruptured disc

## 2018-03-23 NOTE — Assessment & Plan Note (Signed)
Discussed high fiber diet and Metamucil

## 2018-03-23 NOTE — Assessment & Plan Note (Signed)
On CT 10/1 which was 1.4 to 1.1 cm.  Probable cyst.  Offered CT 6 months to a year.  Pt refusing and I'm comfortable with watchful waiting and following liver enzymes.  Normal on 10/1

## 2018-03-28 LAB — CBC WITH DIFFERENTIAL/PLATELET
HEMATOCRIT: 42.6 % (ref 34.0–46.6)
HEMOGLOBIN: 14.8 g/dL (ref 11.1–15.9)
LYMPHS ABS: 1.8 10*3/uL (ref 0.7–3.1)
LYMPHS: 29 %
MCH: 32.2 pg (ref 26.6–33.0)
MCHC: 34.7 g/dL (ref 31.5–35.7)
MCV: 93 fL (ref 79–97)
MID (ABSOLUTE): 0.7 10*3/uL (ref 0.1–1.6)
MID: 11 %
Neutrophils Absolute: 3.8 10*3/uL (ref 1.4–7.0)
Neutrophils: 61 %
Platelets: 305 10*3/uL (ref 150–450)
RBC: 4.59 x10E6/uL (ref 3.77–5.28)
RDW: 13.4 % (ref 12.3–15.4)
WBC: 6.3 10*3/uL (ref 3.4–10.8)

## 2018-07-17 ENCOUNTER — Encounter: Payer: Self-pay | Admitting: Family Medicine

## 2018-07-17 ENCOUNTER — Ambulatory Visit: Payer: BC Managed Care – PPO | Admitting: Family Medicine

## 2018-07-17 VITALS — BP 124/82 | HR 85 | Temp 98.4°F | Ht 63.0 in | Wt 154.9 lb

## 2018-07-17 DIAGNOSIS — R062 Wheezing: Secondary | ICD-10-CM | POA: Diagnosis not present

## 2018-07-17 DIAGNOSIS — J069 Acute upper respiratory infection, unspecified: Secondary | ICD-10-CM | POA: Diagnosis not present

## 2018-07-17 MED ORDER — HYDROCOD POLST-CPM POLST ER 10-8 MG/5ML PO SUER: 5.0000 mL | Freq: Every evening | ORAL | 0 refills | Status: DC | PRN

## 2018-07-17 MED ORDER — ALBUTEROL SULFATE (2.5 MG/3ML) 0.083% IN NEBU: 2.5000 mg | INHALATION_SOLUTION | Freq: Once | RESPIRATORY_TRACT | Status: DC

## 2018-07-17 MED ORDER — PREDNISONE 10 MG PO TABS: ORAL_TABLET | ORAL | 0 refills | Status: DC

## 2018-07-17 MED ORDER — BENZONATATE 200 MG PO CAPS: 200.0000 mg | ORAL_CAPSULE | Freq: Three times a day (TID) | ORAL | 0 refills | Status: DC | PRN

## 2018-07-17 MED ORDER — AZITHROMYCIN 250 MG PO TABS: ORAL_TABLET | ORAL | 0 refills | Status: DC

## 2018-07-17 NOTE — Progress Notes (Signed)
BP 124/82 (BP Location: Left Arm, Patient Position: Sitting, Cuff Size: Normal)   Pulse 85   Temp 98.4 F (36.9 C) (Oral)   Ht 5\' 3"  (1.6 m)   Wt 154 lb 14.4 oz (70.3 kg)   SpO2 93%   BMI 27.44 kg/m    Subjective:    Patient ID: Lindsay Lane, female    DOB: Dec 29, 1960, 58 y.o.   MRN: [id number]  HPI: Lindsay Lane is a 58 y.o. female  Chief Complaint  Patient presents with  . Cough    Productive, using mucinex and albeuterol inhaler. Ongoing 2-3 weeks.  . Wheezing  . Shortness of Breath   Congestion, productive cough, wheezing, SOB, fatigue x 3 weeks. Taking mucinex, inhaler, lemon water, cough drops with no relief. Hx of allergic rhinitis, recurrent sinus issues currently on flonase and zyrtec daily. Several sick contacts.   Relevant past medical, surgical, family and social history reviewed and updated as indicated. Interim medical history since our last visit reviewed. Allergies and medications reviewed and updated.  Review of Systems  Per HPI unless specifically indicated above     Objective:    BP 124/82 (BP Location: Left Arm, Patient Position: Sitting, Cuff Size: Normal)   Pulse 85   Temp 98.4 F (36.9 C) (Oral)   Ht 5\' 3"  (1.6 m)   Wt 154 lb 14.4 oz (70.3 kg)   SpO2 93%   BMI 27.44 kg/m   Wt Readings from Last 3 Encounters:  07/17/18 154 lb 14.4 oz (70.3 kg)  03/23/18 160 lb 1.6 oz (72.6 kg)  03/20/18 155 lb 11.2 oz (70.6 kg)    Physical Exam Vitals signs and nursing note reviewed.  Constitutional:      Appearance: Normal appearance.  HENT:     Head: Atraumatic.     Right Ear: Tympanic membrane and external ear normal.     Left Ear: Tympanic membrane and external ear normal.     Nose: Congestion present.     Mouth/Throat:     Mouth: Mucous membranes are moist.     Pharynx: Posterior oropharyngeal erythema present.  Eyes:     Extraocular Movements: Extraocular movements intact.     Conjunctiva/sclera: Conjunctivae normal.  Neck:   Musculoskeletal: Normal range of motion and neck supple.  Cardiovascular:     Rate and Rhythm: Normal rate and regular rhythm.     Heart sounds: Normal heart sounds.  Pulmonary:     Effort: Pulmonary effort is normal.     Breath sounds: Wheezing present. No rales.  Musculoskeletal: Normal range of motion.  Skin:    General: Skin is warm and dry.  Neurological:     Mental Status: She is alert and oriented to person, place, and time.  Psychiatric:        Mood and Affect: Mood normal.        Thought Content: Thought content normal.     Results for orders placed or performed in visit on 03/20/18  UA/M w/rflx Culture, Routine  Result Value Ref Range   Specific Gravity, UA 1.015 1.005 - 1.030   pH, UA 7.5 5.0 - 7.5   Color, UA Yellow Yellow   Appearance Ur Cloudy (A) Clear   Leukocytes, UA Negative Negative   Protein, UA Negative Negative/Trace   Glucose, UA Negative Negative   Ketones, UA Negative Negative   RBC, UA Negative Negative   Bilirubin, UA Negative Negative   Urobilinogen, Ur 0.2 0.2 - 1.0 mg/dL   Nitrite,  UA Negative Negative  CBC With Differential/Platelet  Result Value Ref Range   WBC 6.3 3.4 - 10.8 x10E3/uL   RBC 4.59 3.77 - 5.28 x10E6/uL   Hemoglobin 14.8 11.1 - 15.9 g/dL   Hematocrit 42.6 34.0 - 46.6 %   MCV 93 79 - 97 fL   MCH 32.2 26.6 - 33.0 pg   MCHC 34.7 31.5 - 35.7 g/dL   RDW 13.4 12.3 - 15.4 %   Platelets 305 150 - 450 x10E3/uL   Neutrophils 61 Not Estab. %   Lymphs 29 Not Estab. %   MID 11 Not Estab. %   Neutrophils Absolute 3.8 1.4 - 7.0 x10E3/uL   Lymphocytes Absolute 1.8 0.7 - 3.1 x10E3/uL   MID (Absolute) 0.7 0.1 - 1.6 X10E3/uL  Comprehensive metabolic panel  Result Value Ref Range   Glucose 77 65 - 99 mg/dL   BUN 13 6 - 24 mg/dL   Creatinine, Ser 0.84 0.57 - 1.00 mg/dL   GFR calc non Af Amer 77 >59 mL/min/1.73   GFR calc Af Amer 89 >59 mL/min/1.73   BUN/Creatinine Ratio 15 9 - 23   Sodium 144 134 - 144 mmol/L   Potassium 4.2 3.5 - 5.2  mmol/L   Chloride 103 96 - 106 mmol/L   CO2 25 20 - 29 mmol/L   Calcium 9.3 8.7 - 10.2 mg/dL   Total Protein 6.2 6.0 - 8.5 g/dL   Albumin 4.1 3.5 - 5.5 g/dL   Globulin, Total 2.1 1.5 - 4.5 g/dL   Albumin/Globulin Ratio 2.0 1.2 - 2.2   Bilirubin Total 0.3 0.0 - 1.2 mg/dL   Alkaline Phosphatase 97 39 - 117 IU/L   AST 21 0 - 40 IU/L   ALT 16 0 - 32 IU/L  Specimen status report  Result Value Ref Range   specimen status report Comment       Assessment & Plan:   Problem List Items Addressed This Visit    None    Visit Diagnoses    Upper respiratory tract infection, unspecified type    -  Primary   Neb tx given in clinic with some relief. Tx with zpak, prednisone, tessalon, tussionex. Sedation precautions and supportive care reviewed. F/u if not better   Relevant Medications   azithromycin (ZITHROMAX) 250 MG tablet   Wheezing       Relevant Medications   albuterol (PROVENTIL) (2.5 MG/3ML) 0.083% nebulizer solution 2.5 mg       Follow up plan: Return if symptoms worsen or fail to improve.

## 2018-09-05 ENCOUNTER — Ambulatory Visit: Payer: BC Managed Care – PPO | Admitting: Family Medicine

## 2018-09-05 ENCOUNTER — Encounter: Payer: Self-pay | Admitting: Family Medicine

## 2018-09-05 ENCOUNTER — Other Ambulatory Visit: Payer: Self-pay

## 2018-09-05 VITALS — BP 137/88 | HR 65 | Temp 97.8°F

## 2018-09-05 DIAGNOSIS — R062 Wheezing: Secondary | ICD-10-CM | POA: Diagnosis not present

## 2018-09-05 DIAGNOSIS — J0100 Acute maxillary sinusitis, unspecified: Secondary | ICD-10-CM

## 2018-09-05 DIAGNOSIS — J3089 Other allergic rhinitis: Secondary | ICD-10-CM | POA: Diagnosis not present

## 2018-09-05 MED ORDER — DOXYCYCLINE HYCLATE 100 MG PO TABS: 100.0000 mg | ORAL_TABLET | Freq: Two times a day (BID) | ORAL | 0 refills | Status: DC

## 2018-09-05 MED ORDER — ALBUTEROL SULFATE HFA 108 (90 BASE) MCG/ACT IN AERS: 2.0000 | INHALATION_SPRAY | Freq: Four times a day (QID) | RESPIRATORY_TRACT | 2 refills | Status: DC | PRN

## 2018-09-05 MED ORDER — BUDESONIDE-FORMOTEROL FUMARATE 160-4.5 MCG/ACT IN AERO: 2.0000 | INHALATION_SPRAY | Freq: Two times a day (BID) | RESPIRATORY_TRACT | 1 refills | Status: DC

## 2018-09-05 MED ORDER — MONTELUKAST SODIUM 10 MG PO TABS: 10.0000 mg | ORAL_TABLET | Freq: Every day | ORAL | 3 refills | Status: DC

## 2018-09-05 MED ORDER — FLUTICASONE PROPIONATE 50 MCG/ACT NA SUSP: 1.0000 | Freq: Two times a day (BID) | NASAL | 11 refills | Status: AC

## 2018-09-05 NOTE — Progress Notes (Signed)
BP 137/88   Pulse 65   Temp 97.8 F (36.6 C) (Oral)   SpO2 95%    Subjective:    Patient ID: Lindsay Lane, female    DOB: Dec 06, 1960, 58 y.o.   MRN: [id number]  HPI: Lindsay Lane is a 58 y.o. female  Chief Complaint  Patient presents with  . URI    pt states she has had a cough, congestion, and wheezing for about 2 weeks. States the inhaler is not helping and it worse at night.    Here today with over 2 weeks of cough, congestion, sinus pain and pressure, and wheezing that's worst at night. Hx of allergies and chronic sinusitis currently on zyrtec and flonase. Also trying mucinex, sinus rinses, OTC cold and flu medicines with minimal relief. Several sick contacts recently.   Relevant past medical, surgical, family and social history reviewed and updated as indicated. Interim medical history since our last visit reviewed. Allergies and medications reviewed and updated.  Review of Systems  Per HPI unless specifically indicated above     Objective:    BP 137/88   Pulse 65   Temp 97.8 F (36.6 C) (Oral)   SpO2 95%   Wt Readings from Last 3 Encounters:  07/17/18 154 lb 14.4 oz (70.3 kg)  03/23/18 160 lb 1.6 oz (72.6 kg)  03/20/18 155 lb 11.2 oz (70.6 kg)    Physical Exam Vitals signs and nursing note reviewed.  Constitutional:      Appearance: Normal appearance.  HENT:     Head: Atraumatic.     Comments: B/l maxillary sinuses ttp    Right Ear: Tympanic membrane and external ear normal.     Left Ear: Tympanic membrane and external ear normal.     Nose: Congestion present.     Mouth/Throat:     Mouth: Mucous membranes are moist.     Pharynx: Posterior oropharyngeal erythema present.  Eyes:     Extraocular Movements: Extraocular movements intact.     Conjunctiva/sclera: Conjunctivae normal.  Neck:     Musculoskeletal: Normal range of motion and neck supple.  Cardiovascular:     Rate and Rhythm: Normal rate and regular rhythm.     Heart sounds: Normal heart  sounds.  Pulmonary:     Effort: Pulmonary effort is normal.     Breath sounds: Wheezing present.  Musculoskeletal: Normal range of motion.  Skin:    General: Skin is warm and dry.  Neurological:     Mental Status: She is alert and oriented to person, place, and time.  Psychiatric:        Mood and Affect: Mood normal.        Thought Content: Thought content normal.     Results for orders placed or performed in visit on 03/20/18  UA/M w/rflx Culture, Routine  Result Value Ref Range   Specific Gravity, UA 1.015 1.005 - 1.030   pH, UA 7.5 5.0 - 7.5   Color, UA Yellow Yellow   Appearance Ur Cloudy (A) Clear   Leukocytes, UA Negative Negative   Protein, UA Negative Negative/Trace   Glucose, UA Negative Negative   Ketones, UA Negative Negative   RBC, UA Negative Negative   Bilirubin, UA Negative Negative   Urobilinogen, Ur 0.2 0.2 - 1.0 mg/dL   Nitrite, UA Negative Negative  CBC With Differential/Platelet  Result Value Ref Range   WBC 6.3 3.4 - 10.8 x10E3/uL   RBC 4.59 3.77 - 5.28 x10E6/uL   Hemoglobin 14.8  11.1 - 15.9 g/dL   Hematocrit 42.6 34.0 - 46.6 %   MCV 93 79 - 97 fL   MCH 32.2 26.6 - 33.0 pg   MCHC 34.7 31.5 - 35.7 g/dL   RDW 13.4 12.3 - 15.4 %   Platelets 305 150 - 450 x10E3/uL   Neutrophils 61 Not Estab. %   Lymphs 29 Not Estab. %   MID 11 Not Estab. %   Neutrophils Absolute 3.8 1.4 - 7.0 x10E3/uL   Lymphocytes Absolute 1.8 0.7 - 3.1 x10E3/uL   MID (Absolute) 0.7 0.1 - 1.6 X10E3/uL  Comprehensive metabolic panel  Result Value Ref Range   Glucose 77 65 - 99 mg/dL   BUN 13 6 - 24 mg/dL   Creatinine, Ser 0.84 0.57 - 1.00 mg/dL   GFR calc non Af Amer 77 >59 mL/min/1.73   GFR calc Af Amer 89 >59 mL/min/1.73   BUN/Creatinine Ratio 15 9 - 23   Sodium 144 134 - 144 mmol/L   Potassium 4.2 3.5 - 5.2 mmol/L   Chloride 103 96 - 106 mmol/L   CO2 25 20 - 29 mmol/L   Calcium 9.3 8.7 - 10.2 mg/dL   Total Protein 6.2 6.0 - 8.5 g/dL   Albumin 4.1 3.5 - 5.5 g/dL    Globulin, Total 2.1 1.5 - 4.5 g/dL   Albumin/Globulin Ratio 2.0 1.2 - 2.2   Bilirubin Total 0.3 0.0 - 1.2 mg/dL   Alkaline Phosphatase 97 39 - 117 IU/L   AST 21 0 - 40 IU/L   ALT 16 0 - 32 IU/L  Specimen status report  Result Value Ref Range   specimen status report Comment       Assessment & Plan:   Problem List Items Addressed This Visit      Respiratory   Allergic rhinitis    Add singulair to current regimen for better control.        Other Visit Diagnoses    Acute maxillary sinusitis, recurrence not specified    -  Primary   Tx with doxycycline, allergy regimen, supportive home care. F/u if not improving   Relevant Medications   fluticasone (FLONASE) 50 MCG/ACT nasal spray   doxycycline (VIBRA-TABS) 100 MG tablet   Wheezing       Tx with symbicort BID x 2 or so - until feeling much better. Add singulair for better allergy control       Follow up plan: Return if symptoms worsen or fail to improve.

## 2018-09-06 ENCOUNTER — Telehealth: Payer: Self-pay

## 2018-09-06 NOTE — Telephone Encounter (Signed)
Received fax from [facility] stating that Symbicort is not covered by patient's insurance and they are requesting alternative medication be sent in. Please advise.

## 2018-09-06 NOTE — Telephone Encounter (Signed)
Should be able to download a free savings card off the drug website that should work for any commercially insured patient even if not covered - if not, please have pharmacy clarify preferred inhaler

## 2018-09-06 NOTE — Telephone Encounter (Signed)
Patient paid out of pocket. Advised to try coupon card next time.

## 2018-09-08 DIAGNOSIS — J309 Allergic rhinitis, unspecified: Secondary | ICD-10-CM | POA: Insufficient documentation

## 2018-09-08 NOTE — Assessment & Plan Note (Signed)
Add singulair to current regimen for better control.

## 2018-11-13 ENCOUNTER — Encounter: Payer: BC Managed Care – PPO | Admitting: Obstetrics and Gynecology

## 2018-12-25 ENCOUNTER — Other Ambulatory Visit: Payer: Self-pay

## 2018-12-25 MED ORDER — ESTRING 2 MG VA RING: 2.0000 mg | VAGINAL_RING | VAGINAL | 3 refills | Status: DC

## 2018-12-31 ENCOUNTER — Other Ambulatory Visit: Payer: Self-pay

## 2018-12-31 ENCOUNTER — Other Ambulatory Visit: Payer: BC Managed Care – PPO

## 2018-12-31 ENCOUNTER — Encounter: Payer: Self-pay | Admitting: Family Medicine

## 2018-12-31 ENCOUNTER — Telehealth: Payer: Self-pay | Admitting: *Deleted

## 2018-12-31 ENCOUNTER — Ambulatory Visit (INDEPENDENT_AMBULATORY_CARE_PROVIDER_SITE_OTHER): Payer: BC Managed Care – PPO | Admitting: Family Medicine

## 2018-12-31 VITALS — Temp 97.6°F

## 2018-12-31 DIAGNOSIS — J0100 Acute maxillary sinusitis, unspecified: Secondary | ICD-10-CM | POA: Diagnosis not present

## 2018-12-31 DIAGNOSIS — Z20822 Contact with and (suspected) exposure to covid-19: Secondary | ICD-10-CM

## 2018-12-31 MED ORDER — PREDNISONE 10 MG PO TABS: ORAL_TABLET | ORAL | 0 refills | Status: DC

## 2018-12-31 MED ORDER — AMOXICILLIN-POT CLAVULANATE 875-125 MG PO TABS: 1.0000 | ORAL_TABLET | Freq: Two times a day (BID) | ORAL | 0 refills | Status: DC

## 2018-12-31 NOTE — Telephone Encounter (Signed)
Spoke with patient, scheduled her for COVID 19 test today at 2:30 at [facility].  Testing protocol reviewed.

## 2018-12-31 NOTE — Telephone Encounter (Signed)
-----   Message from [facility], [place] sent at 12/31/2018 10:31 AM EDT ----- Regarding: COVID 19 testing 1 week of wheezing, SOB, cough, sinus pressure, nausea.  Lindsay Lane

## 2018-12-31 NOTE — Progress Notes (Signed)
Temp 97.6 F (36.4 C)    Subjective:    Patient ID: Lindsay Lane, female    DOB: [birth year], 58 y.o.   MRN: [id number]  HPI: Lindsay Lane is a 58 y.o. female  Chief Complaint  Patient presents with  . URI    X 1 week, got worse over the weekend, has been taking mucinex since Thurday    . This visit was completed via WebEx due to the restrictions of the COVID-19 pandemic. All issues as above were discussed and addressed. Physical exam was done as above through visual confirmation on WebEx. If it was felt that the patient should be evaluated in the office, they were directed there. The patient verbally consented to this visit. . Location of the patient: home . Location of the provider: home . Those involved with this call:  . Provider: Merrie Roof, PA-C . CMA: Tiffany Reel, CMA . Front Desk/Registration: Jill Side  . Time spent on call: 15 minutes with patient face to face via video conference. More than 50% of this time was spent in counseling and coordination of care. 5 minutes total spent in review of patient's record and preparation of their chart. I verified patient identity using two factors (patient name and date of birth). Patient consents verbally to being seen via telemedicine visit today.   About a week of wheezing, SOB, nausea, sinus headache.  Worst when going outside in the heat. Using what's left of her symbicort which is helping a little bit. Also taking mucinex and stopping her allergy regimen last week. Hx of allergic rhinitis and chronic recurrent sinusitis. Denies fevers, chills, body aches, CP, vomiting, diarrhea, COVID 19 positive contacts, recent travel.   Relevant past medical, surgical, family and social history reviewed and updated as indicated. Interim medical history since our last visit reviewed. Allergies and medications reviewed and updated.  Review of Systems  Per HPI unless specifically indicated above     Objective:    Temp 97.6 F  (36.4 C)   Wt Readings from Last 3 Encounters:  07/17/18 154 lb 14.4 oz (70.3 kg)  03/23/18 160 lb 1.6 oz (72.6 kg)  03/20/18 155 lb 11.2 oz (70.6 kg)    Physical Exam Vitals signs and nursing note reviewed.  Constitutional:      General: She is not in acute distress.    Appearance: Normal appearance.  HENT:     Head: Atraumatic.     Right Ear: External ear normal.     Left Ear: External ear normal.     Nose: Congestion present.     Mouth/Throat:     Mouth: Mucous membranes are moist.     Pharynx: Oropharynx is clear. Posterior oropharyngeal erythema present.  Eyes:     Extraocular Movements: Extraocular movements intact.     Conjunctiva/sclera: Conjunctivae normal.  Neck:     Musculoskeletal: Normal range of motion.  Cardiovascular:     Comments: Unable to assess via virtual visit Pulmonary:     Effort: Pulmonary effort is normal. No respiratory distress.  Musculoskeletal: Normal range of motion.  Skin:    General: Skin is dry.     Findings: No erythema.  Neurological:     Mental Status: She is alert and oriented to person, place, and time.  Psychiatric:        Mood and Affect: Mood normal.        Thought Content: Thought content normal.        Judgment: Judgment normal.  Results for orders placed or performed in visit on 03/20/18  UA/M w/rflx Culture, Routine   Specimen: Urine   URINE  Result Value Ref Range   Specific Gravity, UA 1.015 1.005 - 1.030   pH, UA 7.5 5.0 - 7.5   Color, UA Yellow Yellow   Appearance Ur Cloudy (A) Clear   Leukocytes, UA Negative Negative   Protein, UA Negative Negative/Trace   Glucose, UA Negative Negative   Ketones, UA Negative Negative   RBC, UA Negative Negative   Bilirubin, UA Negative Negative   Urobilinogen, Ur 0.2 0.2 - 1.0 mg/dL   Nitrite, UA Negative Negative  CBC With Differential/Platelet  Result Value Ref Range   WBC 6.3 3.4 - 10.8 x10E3/uL   RBC 4.59 3.77 - 5.28 x10E6/uL   Hemoglobin 14.8 11.1 - 15.9 g/dL    Hematocrit 42.6 34.0 - 46.6 %   MCV 93 79 - 97 fL   MCH 32.2 26.6 - 33.0 pg   MCHC 34.7 31.5 - 35.7 g/dL   RDW 13.4 12.3 - 15.4 %   Platelets 305 150 - 450 x10E3/uL   Neutrophils 61 Not Estab. %   Lymphs 29 Not Estab. %   MID 11 Not Estab. %   Neutrophils Absolute 3.8 1.4 - 7.0 x10E3/uL   Lymphocytes Absolute 1.8 0.7 - 3.1 x10E3/uL   MID (Absolute) 0.7 0.1 - 1.6 X10E3/uL  Comprehensive metabolic panel  Result Value Ref Range   Glucose 77 65 - 99 mg/dL   BUN 13 6 - 24 mg/dL   Creatinine, Ser 0.84 0.57 - 1.00 mg/dL   GFR calc non Af Amer 77 >59 mL/min/1.73   GFR calc Af Amer 89 >59 mL/min/1.73   BUN/Creatinine Ratio 15 9 - 23   Sodium 144 134 - 144 mmol/L   Potassium 4.2 3.5 - 5.2 mmol/L   Chloride 103 96 - 106 mmol/L   CO2 25 20 - 29 mmol/L   Calcium 9.3 8.7 - 10.2 mg/dL   Total Protein 6.2 6.0 - 8.5 g/dL   Albumin 4.1 3.5 - 5.5 g/dL   Globulin, Total 2.1 1.5 - 4.5 g/dL   Albumin/Globulin Ratio 2.0 1.2 - 2.2   Bilirubin Total 0.3 0.0 - 1.2 mg/dL   Alkaline Phosphatase 97 39 - 117 IU/L   AST 21 0 - 40 IU/L   ALT 16 0 - 32 IU/L  Specimen status report  Result Value Ref Range   specimen status report Comment       Assessment & Plan:   Problem List Items Addressed This Visit    None    Visit Diagnoses    Acute maxillary sinusitis, recurrence not specified    -  Primary   Tx with augmentin, prednisone, restart allergy regimen. Supportive care and return precautions reviewed   Relevant Medications   predniSONE (DELTASONE) 10 MG tablet   amoxicillin-clavulanate (AUGMENTIN) 875-125 MG tablet    Given cough and SOB, while consistent with her typical allergy/reactive airway flares, will test for COVID 19 and quarantine until results are in   Follow up plan: Return if symptoms worsen or fail to improve.

## 2019-01-04 LAB — NOVEL CORONAVIRUS, NAA: SARS-CoV-2, NAA: NOT DETECTED

## 2019-01-16 ENCOUNTER — Encounter: Payer: BC Managed Care – PPO | Admitting: Obstetrics and Gynecology

## 2019-03-05 ENCOUNTER — Ambulatory Visit (INDEPENDENT_AMBULATORY_CARE_PROVIDER_SITE_OTHER): Payer: BC Managed Care – PPO | Admitting: Family Medicine

## 2019-03-05 ENCOUNTER — Other Ambulatory Visit: Payer: Self-pay

## 2019-03-05 ENCOUNTER — Encounter: Payer: Self-pay | Admitting: Family Medicine

## 2019-03-05 VITALS — Ht 63.0 in | Wt 153.2 lb

## 2019-03-05 DIAGNOSIS — J3089 Other allergic rhinitis: Secondary | ICD-10-CM

## 2019-03-05 DIAGNOSIS — J0101 Acute recurrent maxillary sinusitis: Secondary | ICD-10-CM | POA: Diagnosis not present

## 2019-03-05 DIAGNOSIS — J4541 Moderate persistent asthma with (acute) exacerbation: Secondary | ICD-10-CM | POA: Diagnosis not present

## 2019-03-05 MED ORDER — TRELEGY ELLIPTA 100-62.5-25 MCG/INH IN AEPB: 1.0000 | INHALATION_SPRAY | Freq: Every day | RESPIRATORY_TRACT | 3 refills | Status: AC

## 2019-03-05 MED ORDER — ALBUTEROL SULFATE HFA 108 (90 BASE) MCG/ACT IN AERS: 2.0000 | INHALATION_SPRAY | Freq: Four times a day (QID) | RESPIRATORY_TRACT | 2 refills | Status: AC | PRN

## 2019-03-05 MED ORDER — PREDNISONE 10 MG PO TABS: ORAL_TABLET | ORAL | 0 refills | Status: DC

## 2019-03-05 MED ORDER — MONTELUKAST SODIUM 10 MG PO TABS: 10.0000 mg | ORAL_TABLET | Freq: Every day | ORAL | 3 refills | Status: AC

## 2019-03-05 MED ORDER — BUDESONIDE-FORMOTEROL FUMARATE 160-4.5 MCG/ACT IN AERO: 2.0000 | INHALATION_SPRAY | Freq: Two times a day (BID) | RESPIRATORY_TRACT | 11 refills | Status: DC

## 2019-03-05 MED ORDER — DOXYCYCLINE HYCLATE 100 MG PO TABS: 100.0000 mg | ORAL_TABLET | Freq: Two times a day (BID) | ORAL | 0 refills | Status: DC

## 2019-03-05 NOTE — Progress Notes (Signed)
Ht 5\' 3"  (1.6 m)   Wt 153 lb 3.2 oz (69.5 kg)   BMI 27.14 kg/m    Subjective:    Patient ID: Lindsay Lane, female    DOB: 1961-01-25, 58 y.o.   MRN: [id number]  HPI: Lindsay Lane is a 58 y.o. female  Chief Complaint  Patient presents with  . Nasal Congestion    Ongoing 1 month. Been taking mucinex but symptoms worsening. Patient states symptoms are worse at night.  . Wheezing  . Cough    . This visit was completed via WebEx due to the restrictions of the COVID-19 pandemic. All issues as above were discussed and addressed. Physical exam was done as above through visual confirmation on WebEx. If it was felt that the patient should be evaluated in the office, they were directed there. The patient verbally consented to this visit. . Location of the patient: home . Location of the provider: home . Those involved with this call:  . Provider: Merrie Roof, PA-C . CMA: Merilyn Baba, [place] . Front Desk/Registration: Jill Side  . Time spent on call: 20 minutes with patient face to face via video conference. More than 50% of this time was spent in counseling and coordination of care. 5 minutes total spent in review of patient's record and preparation of their chart. I verified patient identity using two factors (patient name and date of birth). Patient consents verbally to being seen via telemedicine visit today.   Patient presenting today with 1 month of worsening congestion, sinus pain and pressure, productive cough, and wheezing. Denies fevers, chills, CP, SOB, sick contacts, recent travels. Taking mucinex, zyrtec, flonase, and singulair regularly as well as consistently using her symbicort and albuterol for her asthma. Was just treated 2 months ago for a similar episode with resolution temporarily. Notes she gets these flares multiple times per year. Used to go to [facility] for allergy injections about 3-4 years ago which did help at the time.   Relevant past medical, surgical,  family and social history reviewed and updated as indicated. Interim medical history since our last visit reviewed. Allergies and medications reviewed and updated.  Review of Systems  Per HPI unless specifically indicated above     Objective:    Ht 5\' 3"  (1.6 m)   Wt 153 lb 3.2 oz (69.5 kg)   BMI 27.14 kg/m   Wt Readings from Last 3 Encounters:  03/05/19 153 lb 3.2 oz (69.5 kg)  07/17/18 154 lb 14.4 oz (70.3 kg)  03/23/18 160 lb 1.6 oz (72.6 kg)    Physical Exam Vitals signs and nursing note reviewed.  Constitutional:      General: She is not in acute distress.    Appearance: Normal appearance.  HENT:     Head: Atraumatic.     Right Ear: External ear normal.     Left Ear: External ear normal.     Nose: Congestion present.     Mouth/Throat:     Mouth: Mucous membranes are moist.     Pharynx: Oropharynx is clear. Posterior oropharyngeal erythema present.  Eyes:     Extraocular Movements: Extraocular movements intact.     Conjunctiva/sclera: Conjunctivae normal.  Neck:     Musculoskeletal: Normal range of motion.  Cardiovascular:     Comments: Unable to assess via virtual visit Pulmonary:     Effort: Pulmonary effort is normal. No respiratory distress.  Musculoskeletal: Normal range of motion.  Skin:    General: Skin is dry.  Findings: No erythema.  Neurological:     Mental Status: She is alert and oriented to person, place, and time.  Psychiatric:        Mood and Affect: Mood normal.        Thought Content: Thought content normal.        Judgment: Judgment normal.     Results for orders placed or performed in visit on 12/31/18  Novel Coronavirus, NAA ([facility])  Result Value Ref Range   SARS-CoV-2, NAA Not Detected Not Detected      Assessment & Plan:   Problem List Items Addressed This Visit      Respiratory   Allergic rhinitis    Compliant with allergy regimen but still having several flares per year. Will refer back to ENT for further consideration  of long term management strategies      Relevant Orders   Ambulatory referral to ENT   Asthma    Continue good inhaler regimen, will also treat exacerbation with prednisone and abx. F/u if not improving and will obtain CXR      Relevant Medications   montelukast (SINGULAIR) 10 MG tablet   budesonide-formoterol (SYMBICORT) 160-4.5 MCG/ACT inhaler   albuterol (VENTOLIN HFA) 108 (90 Base) MCG/ACT inhaler   Fluticasone-Umeclidin-Vilant (TRELEGY ELLIPTA) 100-62.5-25 MCG/INH AEPB   predniSONE (DELTASONE) 10 MG tablet    Other Visit Diagnoses    Recurrent maxillary sinusitis    -  Primary   Will tx with prednisone, abx, and continued allergy/OTC supportive care regimen. F/u if not improving. Referral to ENT placed given frequency   Relevant Medications   doxycycline (VIBRA-TABS) 100 MG tablet   predniSONE (DELTASONE) 10 MG tablet   Other Relevant Orders   Ambulatory referral to ENT   Moderate persistent reactive airway disease with acute exacerbation       Relevant Medications   montelukast (SINGULAIR) 10 MG tablet   budesonide-formoterol (SYMBICORT) 160-4.5 MCG/ACT inhaler   albuterol (VENTOLIN HFA) 108 (90 Base) MCG/ACT inhaler   Fluticasone-Umeclidin-Vilant (TRELEGY ELLIPTA) 100-62.5-25 MCG/INH AEPB   predniSONE (DELTASONE) 10 MG tablet       Follow up plan: Return if symptoms worsen or fail to improve.

## 2019-03-08 DIAGNOSIS — J45909 Unspecified asthma, uncomplicated: Secondary | ICD-10-CM | POA: Insufficient documentation

## 2019-03-08 NOTE — Assessment & Plan Note (Signed)
Continue good inhaler regimen, will also treat exacerbation with prednisone and abx. F/u if not improving and will obtain CXR

## 2019-03-08 NOTE — Assessment & Plan Note (Signed)
Compliant with allergy regimen but still having several flares per year. Will refer back to ENT for further consideration of long term management strategies

## 2019-03-26 ENCOUNTER — Other Ambulatory Visit: Payer: Self-pay

## 2019-03-26 ENCOUNTER — Ambulatory Visit (INDEPENDENT_AMBULATORY_CARE_PROVIDER_SITE_OTHER): Payer: BC Managed Care – PPO | Admitting: Obstetrics and Gynecology

## 2019-03-26 ENCOUNTER — Encounter: Payer: Self-pay | Admitting: Obstetrics and Gynecology

## 2019-03-26 VITALS — BP 136/82 | HR 74 | Ht 63.0 in | Wt 161.5 lb

## 2019-03-26 DIAGNOSIS — Z1231 Encounter for screening mammogram for malignant neoplasm of breast: Secondary | ICD-10-CM | POA: Diagnosis not present

## 2019-03-26 DIAGNOSIS — Z01419 Encounter for gynecological examination (general) (routine) without abnormal findings: Secondary | ICD-10-CM | POA: Diagnosis not present

## 2019-03-26 DIAGNOSIS — Z23 Encounter for immunization: Secondary | ICD-10-CM | POA: Diagnosis not present

## 2019-03-26 DIAGNOSIS — R350 Frequency of micturition: Secondary | ICD-10-CM | POA: Diagnosis not present

## 2019-03-26 DIAGNOSIS — N952 Postmenopausal atrophic vaginitis: Secondary | ICD-10-CM | POA: Diagnosis not present

## 2019-03-26 DIAGNOSIS — Z780 Asymptomatic menopausal state: Secondary | ICD-10-CM | POA: Diagnosis not present

## 2019-03-26 DIAGNOSIS — G479 Sleep disorder, unspecified: Secondary | ICD-10-CM

## 2019-03-26 DIAGNOSIS — R339 Retention of urine, unspecified: Secondary | ICD-10-CM

## 2019-03-26 DIAGNOSIS — Z90711 Acquired absence of uterus with remaining cervical stump: Secondary | ICD-10-CM

## 2019-03-26 DIAGNOSIS — L659 Nonscarring hair loss, unspecified: Secondary | ICD-10-CM

## 2019-03-26 LAB — POCT URINALYSIS DIPSTICK
Bilirubin, UA: NEGATIVE
Blood, UA: NEGATIVE
Glucose, UA: NEGATIVE
Ketones, UA: NEGATIVE
Leukocytes, UA: NEGATIVE
Nitrite, UA: NEGATIVE
Odor: NEGATIVE
Protein, UA: NEGATIVE
Spec Grav, UA: <=1.005 — AB (ref 1.010–1.025)
Urobilinogen, UA: 0.2 E.U./dL
pH, UA: 6 (ref 5.0–8.0)

## 2019-03-26 NOTE — Progress Notes (Signed)
ANNUAL PREVENTATIVE CARE GYNECOLOGY  ENCOUNTER NOTE  Subjective:       Lindsay Lane is a 58 y.o. [id number] female here for a routine annual gynecologic exam. She is transitioning care from Dr. Enzo Bi who has retired.  The patient is sexually active. The patient is taking hormone replacement therapy. Patient denies post-menopausal vaginal bleeding. The patient wears seatbelts: yes. The patient participates in regular exercise: no. Has the patient ever been transfused or tattooed?: no. The patient reports that there is not domestic violence in her life.  Current complaints: 1.   Feelings of incomplete bladder emptying, and urinary frequency.  Notes that she voids ~ 5-6 times daily, and between 2-3 times at night.  This has been ongoing for several months.  Denies heavy caffeine intake, and has adequate intake of water, but usually stops consuming liquids around 8 pm.  Goes to bed usually around 11 pm.  Denies dysuria or hematuria.  2. Complains of hair loss for the past several weeks.  Notes that her hair is falling out at times in large clumps. Denies any recent changes in her diet, stress levels, or new medical conditions/medications.  3. Difficulty sleeping.  She sates that she gets an average of ~ 2 hrs per night.  Has difficulty falling asleep.  Does not watch TV in bed very often. Usually will get up and turn the TV on or move around after an hour or so of not falling asleep. Is currently using Melatonin (2 tabs) to help with sleep as well as taking her allergy medications in the evening but this has not been helping.     Gynecologic History No LMP recorded. Patient has had a hysterectomy. Contraception: status post hysterectomy Last Pap: 10/2015. Results were: normal Last mammogram: 12/2017. Results were: normal. Pap smears no longer needed.  Last Colonoscopy: 02/12/2018.  Results were: several sessile polyps and diverticulae. For repeat in 3 years due to personal history of colon polyps.     Obstetric History OB History  Gravida Para Term Preterm AB Living  1 1 1     1   SAB TAB Ectopic Multiple Live Births          1    # Outcome Date GA Lbr Len/2nd Weight Sex Delivery Anes PTL Lv  1 Term 1991   9 lb 4.8 oz (4.218 kg) F Vag-Spont   LIV    Past Medical History:  Diagnosis Date  . Allergy   . DDD (degenerative disc disease), lumbar   . Fibroids   . GERD (gastroesophageal reflux disease)   . Glaucoma   . History of heavy periods   . Reflux   . Ruptured disk   . Seasonal allergies     Family History  Problem Relation Age of Onset  . Emphysema Paternal Grandfather        was a smoker  . Emphysema Maternal Grandfather        was a smoker  . Colon cancer Maternal Grandfather   . Allergies Mother   . Diabetes Mother   . Allergies Sister   . Heart disease Father   . Lymphoma Father   . Colon cancer Maternal Grandmother   . Breast cancer Neg Hx   . Ovarian cancer Neg Hx   . Esophageal cancer Neg Hx   . Rectal cancer Neg Hx   . Stomach cancer Neg Hx     Past Surgical History:  Procedure Laterality Date  . ABDOMINAL HYSTERECTOMY  2008  laposcopic  . ANAL RECTAL MANOMETRY N/A 10/26/2015   Procedure: ANO RECTAL MANOMETRY;  Surgeon: Mauri Pole, MD;  Location: [facility];  Service: Endoscopy;  Laterality: N/A;  . [place], 2000   x 2  . NASAL SINUS SURGERY  11/2011  . VESICOVAGINAL FISTULA CLOSURE W/ TAH  2007    Social History   Socioeconomic History  . Marital status: Single    Spouse name: Not on file  . Number of children: Not on file  . Years of education: Not on file  . Highest education level: Not on file  Occupational History  . Occupation: Therapist, sports: [employer]  . Financial resource strain: Not on file  . Food insecurity    Worry: Not on file    Inability: Not on file  . Transportation needs    Medical: Not on file    Non-medical: Not on file  Tobacco Use  . Smoking status:  Former Smoker    Packs/day: 0.30    Years: 20.00    Pack years: 6.00    Types: Cigarettes    Quit date: 03/21/2011    Years since quitting: 8.0  . Smokeless tobacco: Never Used  Substance and Sexual Activity  . Alcohol use: Yes    Comment: occas  . Drug use: No  . Sexual activity: Not Currently    Birth control/protection: Surgical  Lifestyle  . Physical activity    Days per week: 3 days    Minutes per session: 60 min  . Stress: Not on file  Relationships  . Social Herbalist on phone: Not on file    Gets together: Not on file    Attends religious service: Not on file    Active member of club or organization: Not on file    Attends meetings of clubs or organizations: Not on file    Relationship status: Not on file  . Intimate partner violence    Fear of current or ex partner: Not on file    Emotionally abused: Not on file    Physically abused: Not on file    Forced sexual activity: Not on file  Other Topics Concern  . Not on file  Social History Narrative  . Not on file    Current Outpatient Medications on File Prior to Visit  Medication Sig Dispense Refill  . albuterol (VENTOLIN HFA) 108 (90 Base) MCG/ACT inhaler Inhale 2 puffs into the lungs every 6 (six) hours as needed for wheezing or shortness of breath. 18 g 2  . b complex vitamins tablet Take 1 tablet by mouth daily.    . cetirizine (ZYRTEC) 10 MG tablet Take 10 mg by mouth daily.    Marland Kitchen estradiol (ESTRING) 2 MG vaginal ring Place 2 mg vaginally every 3 (three) months. follow package directions 1 each 3  . fluticasone (FLONASE) 50 MCG/ACT nasal spray Place 1 spray into both nostrils 2 (two) times daily. 16 g 11  . Fluticasone-Umeclidin-Vilant (TRELEGY ELLIPTA) 100-62.5-25 MCG/INH AEPB Inhale 1 puff into the lungs daily. 60 each 3  . montelukast (SINGULAIR) 10 MG tablet Take 1 tablet (10 mg total) by mouth at bedtime. 90 tablet 3  . omeprazole (PRILOSEC OTC) 20 MG tablet Take 20 mg by mouth daily before  breakfast.     . timolol (TIMOPTIC) 0.5 % ophthalmic solution      No current facility-administered medications on file prior to visit.  No Known Allergies    Review of Systems ROS Review of Systems - General ROS: negative for - chills, fatigue, fever, hot flashes, night sweats, weight gain or weight loss. Positive for sleep disturbances.  Psychological ROS: negative for - anxiety, decreased libido, depression, mood swings, physical abuse or sexual abuse Ophthalmic ROS: negative for - blurry vision, eye pain or loss of vision ENT ROS: negative for - headaches, hearing change, visual changes or vocal changes Allergy and Immunology ROS: negative for - hives, itchy/watery eyes or seasonal allergies Hematological and Lymphatic ROS: negative for - bleeding problems, bruising, swollen lymph nodes or weight loss Endocrine ROS: negative for - galactorrhea, hot flashes, malaise/lethargy, mood swings, palpitations, polydipsia/polyuria, skin changes, temperature intolerance or unexpected weight changes. Positive for hair loss. Breast ROS: negative for - new or changing breast lumps or nipple discharge Respiratory ROS: negative for - cough or shortness of breath Cardiovascular ROS: negative for - chest pain, irregular heartbeat, palpitations or shortness of breath Gastrointestinal ROS: no abdominal pain, change in bowel habits, or black or bloody stools Genito-Urinary ROS: no dysuria, trouble voiding, or hematuria. Positive for incomplete bladder emptying, urinary frequency, and nocturia.  Musculoskeletal ROS: negative for - joint pain or joint stiffness Neurological ROS: negative for - bowel and bladder control changes Dermatological ROS: negative for rash and skin lesion changes   Objective:   BP 136/82   Pulse 74   Ht 5\' 3"  (1.6 m)   Wt 161 lb 8 oz (73.3 kg)   BMI 28.61 kg/m  CONSTITUTIONAL: Well-developed, well-nourished female in no acute distress. Overweight.  PSYCHIATRIC: Normal  mood and affect. Normal behavior. Normal judgment and thought content. [place]: Alert and oriented to person, place, and time. Normal muscle tone coordination. No cranial nerve deficit noted. HENT:  Normocephalic, atraumatic, External right and left ear normal. Oropharynx is clear and moist EYES: Conjunctivae and EOM are normal. Pupils are equal, round, and reactive to light. No scleral icterus.  NECK: Normal range of motion, supple, no masses.  Normal thyroid.  SKIN: Skin is warm and dry. No rash noted. Not diaphoretic. No erythema. No pallor. CARDIOVASCULAR: Normal heart rate noted, regular rhythm, no murmur. RESPIRATORY: Clear to auscultation bilaterally. Effort and breath sounds normal, no problems with respiration noted. BREASTS: Symmetric in size. No masses, skin changes, nipple drainage, or lymphadenopathy. ABDOMEN: Soft, normal bowel sounds, no distention noted.  No tenderness, rebound or guarding.  BLADDER: Normal PELVIC:  Bladder no bladder distension noted  Urethra: normal appearing urethra with no masses, tenderness or lesions  Vulva: normal appearing vulva with no masses, tenderness or lesions  Vagina: mildly atrophic mucosa. No lesions or discharge.   Cervix: surgically absent  Uterus: surgically absent, vaginal cuff well healed  Adnexa: normal adnexa in size, nontender and no masses  RV: External Exam NormaI, No Rectal Masses and Normal Sphincter tone  MUSCULOSKELETAL: Normal range of motion. No tenderness.  No cyanosis, clubbing, or edema.  2+ distal pulses. LYMPHATIC: No Axillary, Supraclavicular, or Inguinal Adenopathy.   Labs: Lab Results  Component Value Date   WBC 6.3 03/20/2018   HGB 14.8 03/20/2018   HCT 42.6 03/20/2018   MCV 93 03/20/2018   PLT 305 03/20/2018    Lab Results  Component Value Date   CREATININE 0.84 03/20/2018   BUN 13 03/20/2018   NA 144 03/20/2018   K 4.2 03/20/2018   CL 103 03/20/2018   CO2 25 03/20/2018    Lab Results  Component  Value Date  ALT 16 03/20/2018   AST 21 03/20/2018   ALKPHOS 97 03/20/2018   BILITOT 0.3 03/20/2018    Lab Results  Component Value Date   CHOL 183 11/07/2017   HDL 62 11/07/2017   LDLCALC 103 (H) 11/07/2017   TRIG 91 11/07/2017   CHOLHDL 3.0 11/07/2017    Lab Results  Component Value Date   TSH 2.690 11/07/2017    Lab Results  Component Value Date   HGBA1C 5.2 11/07/2017     Assessment:   1. Well woman exam with routine gynecological exam   2. Encounter for screening mammogram for breast cancer   3. Menopause   4. Vaginal atrophy   5. Status post supracervical hysterectomy   6. Urinary frequency   7. Incomplete bladder emptying   8. Hair loss   9. Sleep disturbances  10. Need for flu shot   Plan:  Pap: H/o supracervical hysterectomy. Can perform between 3-5 years per recommendations. Can perform next year.  Mammogram: Ordered. Needs to be scheduled.  Stool Guaiac Testing:  Not Ordered.  Patient up to date on colonoscopy.  Labs: CBC, CMP, Lipid 1, FBS, TSH, Hemoglobin A1C and Vit D Level Routine preventative health maintenance measures emphasized: Exercise/Diet/Weight control, Tobacco Warnings, Alcohol/Substance use risks, Stress Management and Peer Pressure Issues Flu vaccine received today.  Discussed other treatment options for sleep disturbances (including ZQuil, Vistaril, Dramamine), sleep hygiene. If still no resolution, can consider other prescription medications.  Incomplete bladder emptying with frequency and nocturia. UA today negative. Discussed bladder irritants, to given handout. To try dietary modifications. If no improvement, can consider OAB medications.  Hair loss, unclear cause. Will order labs to assess. Also discussed use of hair vitamins.  Menopausal, on HRT.  Can continue as symptoms are well managed.  Return to [place], or sooner if symptoms do not improve or worsen.    Rubie Maid, MD [facility]

## 2019-03-26 NOTE — Patient Instructions (Addendum)
Influenza (Flu) Vaccine (Inactivated or Recombinant): What You Need to Know 1. Why get vaccinated? Influenza vaccine can prevent influenza (flu). Flu is a contagious disease that spreads around the [place] every year, usually between October and May. Anyone can get the flu, but it is more dangerous for some people. Infants and young children, people 58 years of age and older, pregnant women, and people with certain health conditions or a weakened immune system are at greatest risk of flu complications. Pneumonia, bronchitis, sinus infections and ear infections are examples of flu-related complications. If you have a medical condition, such as heart disease, cancer or diabetes, flu can make it worse. Flu can cause fever and chills, sore throat, muscle aches, fatigue, cough, headache, and runny or stuffy nose. Some people may have vomiting and diarrhea, though this is more common in children than adults. Each year thousands of people in the [place] die from flu, and many more are hospitalized. Flu vaccine prevents millions of illnesses and flu-related visits to the doctor each year. 2. Influenza vaccine CDC recommends everyone 57 months of age and older get vaccinated every flu season. Children 6 months through 2 years of age may need 2 doses during a single flu season. Everyone else needs only 1 dose each flu season. It takes about 2 weeks for protection to develop after vaccination. There are many flu viruses, and they are always changing. Each year a new flu vaccine is made to protect against three or four viruses that are likely to cause disease in the upcoming flu season. Even when the vaccine doesn't exactly match these viruses, it may still provide some protection. Influenza vaccine does not cause flu. Influenza vaccine may be given at the same time as other vaccines. 3. Talk with your health care provider Tell your vaccine provider if the person getting the vaccine:  Has had an  allergic reaction after a previous dose of influenza vaccine, or has any severe, life-threatening allergies.  Has ever had Guillain-Barr Syndrome (also called GBS). In some cases, your health care provider may decide to postpone influenza vaccination to a future visit. People with minor illnesses, such as a cold, may be vaccinated. People who are moderately or severely ill should usually wait until they recover before getting influenza vaccine. Your health care provider can give you more information. 4. Risks of a vaccine reaction  Soreness, redness, and swelling where shot is given, fever, muscle aches, and headache can happen after influenza vaccine.  There may be a very small increased risk of Guillain-Barr Syndrome (GBS) after inactivated influenza vaccine (the flu shot). Young children who get the flu shot along with pneumococcal vaccine (PCV13), and/or DTaP vaccine at the same time might be slightly more likely to have a seizure caused by fever. Tell your health care provider if a child who is getting flu vaccine has ever had a seizure. People sometimes faint after medical procedures, including vaccination. Tell your provider if you feel dizzy or have vision changes or ringing in the ears. As with any medicine, there is a very remote chance of a vaccine causing a severe allergic reaction, other serious injury, or death. 5. What if there is a serious problem? An allergic reaction could occur after the vaccinated person leaves the clinic. If you see signs of a severe allergic reaction (hives, swelling of the face and throat, difficulty breathing, a fast heartbeat, dizziness, or weakness), call 9-1-1 and get the person to the nearest hospital. For other signs that  concern you, call your health care provider. Adverse reactions should be reported to the Vaccine Adverse Event Reporting System (VAERS). Your health care provider will usually file this report, or you can do it yourself. Visit the  VAERS website at www.vaers.SamedayNews.es or call (503)408-4205.VAERS is only for reporting reactions, and VAERS staff do not give medical advice. 6. The National Vaccine Injury Compensation Program The [employer] Vaccine Injury Compensation Program (VICP) is a federal program that was created to compensate people who may have been injured by certain vaccines. Visit the VICP website at GoldCloset.com.ee or call 972-374-7062 to learn about the program and about filing a claim. There is a time limit to file a claim for compensation. 7. How can I learn more?  Ask your healthcare provider.  Call your local or state health department.  Contact the Centers for Disease Control and Prevention (CDC): ? Call 909-761-3521 (1-800-CDC-INFO) or ? Visit CDC's https://gibson.com/ Vaccine Information Statement (Interim) Inactivated Influenza Vaccine (02/01/2018) This information is not intended to replace advice given to you by your health care provider. Make sure you discuss any questions you have with your health care provider. Document Released: 03/31/2006 Document Revised: 09/25/2018 Document Reviewed: 02/05/2018 Elsevier Patient Education  2020 Superior Maintenance for Postmenopausal Women Menopause is a normal process in which your ability to get pregnant comes to an end. This process happens slowly over many months or years, usually between the ages of 67 and 25. Menopause is complete when you have missed your menstrual periods for 12 months. It is important to talk with your health care provider about some of the most common conditions that affect women after menopause (postmenopausal women). These include heart disease, cancer, and bone loss (osteoporosis). Adopting a healthy lifestyle and getting preventive care can help to promote your health and wellness. The actions you take can also lower your chances of developing some of these common conditions. What should I know about  menopause? During menopause, you may get a number of symptoms, such as:  Hot flashes. These can be moderate or severe.  Night sweats.  Decrease in sex drive.  Mood swings.  Headaches.  Tiredness.  Irritability.  Memory problems.  Insomnia. Choosing to treat or not to treat these symptoms is a decision that you make with your health care provider. Do I need hormone replacement therapy?  Hormone replacement therapy is effective in treating symptoms that are caused by menopause, such as hot flashes and night sweats.  Hormone replacement carries certain risks, especially as you become older. If you are thinking about using estrogen or estrogen with progestin, discuss the benefits and risks with your health care provider. What is my risk for heart disease and stroke? The risk of heart disease, heart attack, and stroke increases as you age. One of the causes may be a change in the body's hormones during menopause. This can affect how your body uses dietary fats, triglycerides, and cholesterol. Heart attack and stroke are medical emergencies. There are many things that you can do to help prevent heart disease and stroke. Watch your blood pressure  High blood pressure causes heart disease and increases the risk of stroke. This is more likely to develop in people who have high blood pressure readings, are of African descent, or are overweight.  Have your blood pressure checked: ? Every 3-5 years if you are 30-48 years of age. ? Every year if you are 36 years old or older. Eat a healthy diet  Eat a diet that includes plenty of vegetables, fruits, low-fat dairy products, and lean protein.  Do not eat a lot of foods that are high in solid fats, added sugars, or sodium. Get regular exercise Get regular exercise. This is one of the most important things you can do for your health. Most adults should:  Try to exercise for at least 150 minutes each week. The exercise should increase your  heart rate and make you sweat (moderate-intensity exercise).  Try to do strengthening exercises at least twice each week. Do these in addition to the moderate-intensity exercise.  Spend less time sitting. Even light physical activity can be beneficial. Other tips  Work with your health care provider to achieve or maintain a healthy weight.  Do not use any products that contain nicotine or tobacco, such as cigarettes, e-cigarettes, and chewing tobacco. If you need help quitting, ask your health care provider.  Know your numbers. Ask your health care provider to check your cholesterol and your blood sugar (glucose). Continue to have your blood tested as directed by your health care provider. Do I need screening for cancer? Depending on your health history and family history, you may need to have cancer screening at different stages of your life. This may include screening for:  Breast cancer.  Cervical cancer.  Lung cancer.  Colorectal cancer. What is my risk for osteoporosis? After menopause, you may be at increased risk for osteoporosis. Osteoporosis is a condition in which bone destruction happens more quickly than new bone creation. To help prevent osteoporosis or the bone fractures that can happen because of osteoporosis, you may take the following actions:  If you are 3-48 years old, get at least 1,000 mg of calcium and at least 600 mg of vitamin D per day.  If you are older than age 81 but younger than age 56, get at least 1,200 mg of calcium and at least 600 mg of vitamin D per day.  If you are older than age 91, get at least 1,200 mg of calcium and at least 800 mg of vitamin D per day. Smoking and drinking excessive alcohol increase the risk of osteoporosis. Eat foods that are rich in calcium and vitamin D, and do weight-bearing exercises several times each week as directed by your health care provider. How does menopause affect my mental health? Depression may occur at any  age, but it is more common as you become older. Common symptoms of depression include:  Low or sad mood.  Changes in sleep patterns.  Changes in appetite or eating patterns.  Feeling an overall lack of motivation or enjoyment of activities that you previously enjoyed.  Frequent crying spells. Talk with your health care provider if you think that you are experiencing depression. General instructions See your health care provider for regular wellness exams and vaccines. This may include:  Scheduling regular health, dental, and eye exams.  Getting and maintaining your vaccines. These include: ? Influenza vaccine. Get this vaccine each year before the flu season begins. ? Pneumonia vaccine. ? Shingles vaccine. ? Tetanus, diphtheria, and pertussis (Tdap) booster vaccine. Your health care provider may also recommend other immunizations. Tell your health care provider if you have ever been abused or do not feel safe at home. Summary  Menopause is a normal process in which your ability to get pregnant comes to an end.  This condition causes hot flashes, night sweats, decreased interest in sex, mood swings, headaches, or lack of sleep.  Treatment for  this condition may include hormone replacement therapy.  Take actions to keep yourself healthy, including exercising regularly, eating a healthy diet, watching your weight, and checking your blood pressure and blood sugar levels.  Get screened for cancer and depression. Make sure that you are up to date with all your vaccines. This information is not intended to replace advice given to you by your health care provider. Make sure you discuss any questions you have with your health care provider. Document Released: 07/29/2005 Document Revised: 05/30/2018 Document Reviewed: 05/30/2018 Elsevier Patient Education  2020 [employer].

## 2019-04-15 NOTE — Addendum Note (Signed)
Addended by: Edwyna Shell on: 04/15/2019 09:31 AM   Modules accepted: Orders

## 2019-04-16 ENCOUNTER — Other Ambulatory Visit: Payer: Self-pay

## 2019-04-16 ENCOUNTER — Other Ambulatory Visit: Payer: BC Managed Care – PPO

## 2019-04-16 DIAGNOSIS — Z01419 Encounter for gynecological examination (general) (routine) without abnormal findings: Secondary | ICD-10-CM

## 2019-04-17 LAB — LIPID PANEL
Chol/HDL Ratio: 3.5 ratio (ref 0.0–4.4)
Cholesterol, Total: 191 mg/dL (ref 100–199)
HDL: 55 mg/dL (ref >39)
LDL Chol Calc (NIH): 106 mg/dL — ABNORMAL HIGH (ref 0–99)
Triglycerides: 171 mg/dL — ABNORMAL HIGH (ref 0–149)
VLDL Cholesterol Cal: 30 mg/dL (ref 5–40)

## 2019-04-17 LAB — CBC
Hematocrit: 46.3 % (ref 34.0–46.6)
Hemoglobin: 15.3 g/dL (ref 11.1–15.9)
MCH: 29.9 pg (ref 26.6–33.0)
MCHC: 33 g/dL (ref 31.5–35.7)
MCV: 90 fL (ref 79–97)
Platelets: 318 10*3/uL (ref 150–450)
RBC: 5.12 x10E6/uL (ref 3.77–5.28)
RDW: 13 % (ref 11.7–15.4)
WBC: 8.5 10*3/uL (ref 3.4–10.8)

## 2019-04-17 LAB — HEMOGLOBIN A1C
Est. average glucose Bld gHb Est-mCnc: 97 mg/dL
Hgb A1c MFr Bld: 5 % (ref 4.8–5.6)

## 2019-04-17 LAB — TSH: TSH: 3.34 u[IU]/mL (ref 0.450–4.500)

## 2019-04-17 LAB — VITAMIN D 25 HYDROXY (VIT D DEFICIENCY, FRACTURES): Vit D, 25-Hydroxy: 26.9 ng/mL — ABNORMAL LOW (ref 30.0–100.0)

## 2019-04-17 LAB — GLUCOSE, RANDOM: Glucose: 77 mg/dL (ref 65–99)

## 2019-10-11 IMAGING — MG MM DIGITAL SCREENING BILAT W/ TOMO W/ CAD
8 series · 8 of 24 positions shown · non-contrast
Comparison: Previous exam(s).

CLINICAL DATA: Screening.

EXAM:
DIGITAL SCREENING BILATERAL MAMMOGRAM WITH TOMO AND CAD

[L MLO synth-2D]
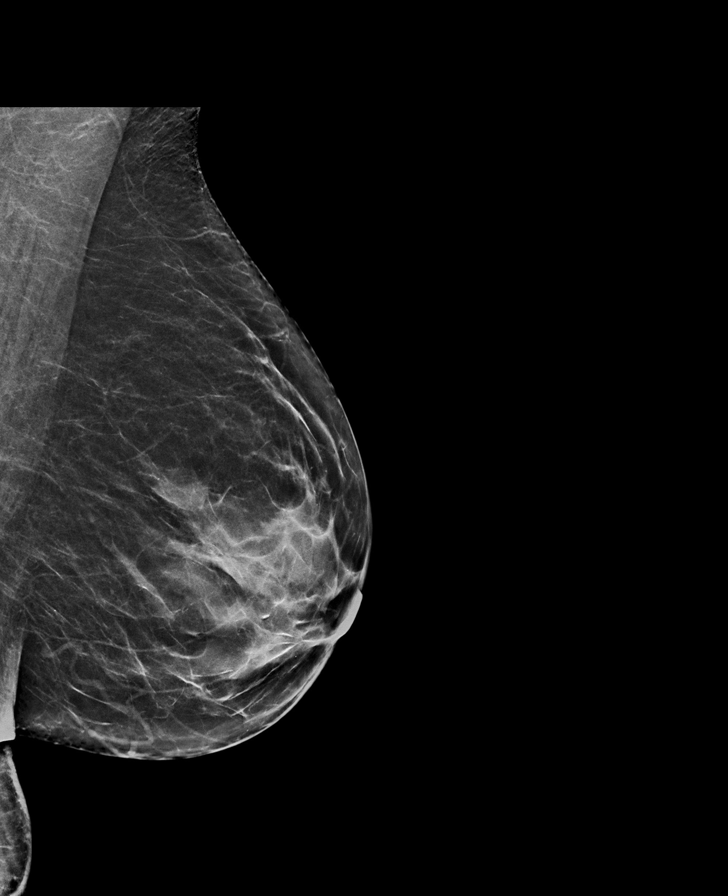

[R MLO synth-2D]
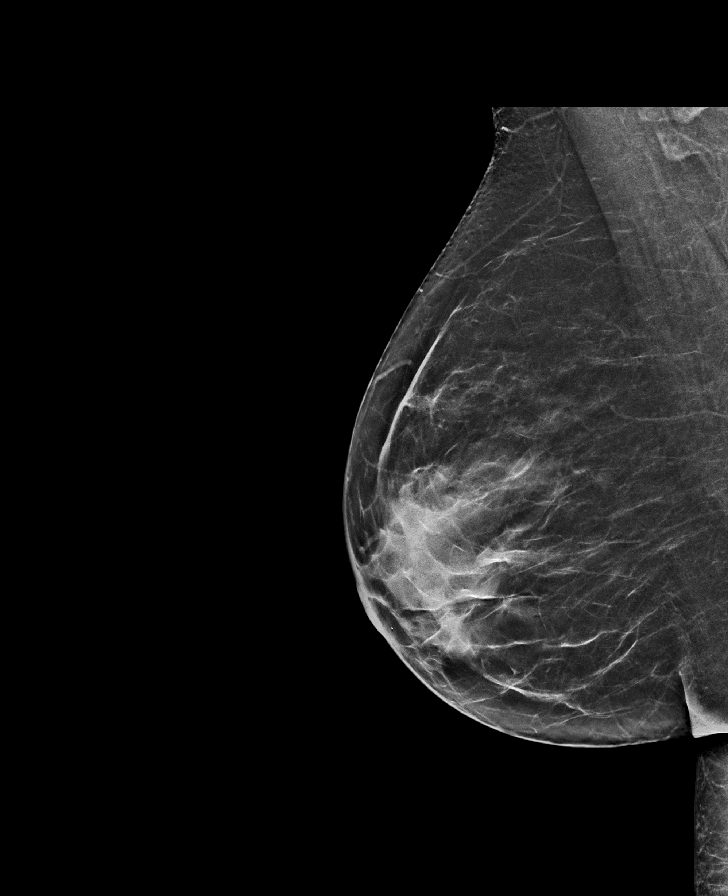

[L CC synth-2D]
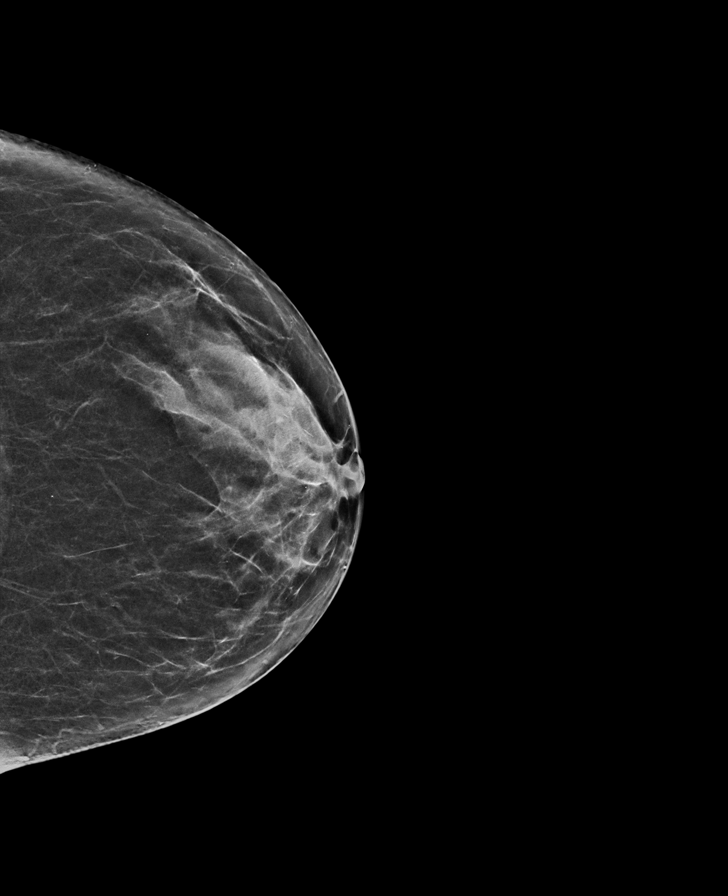

[R CC synth-2D]
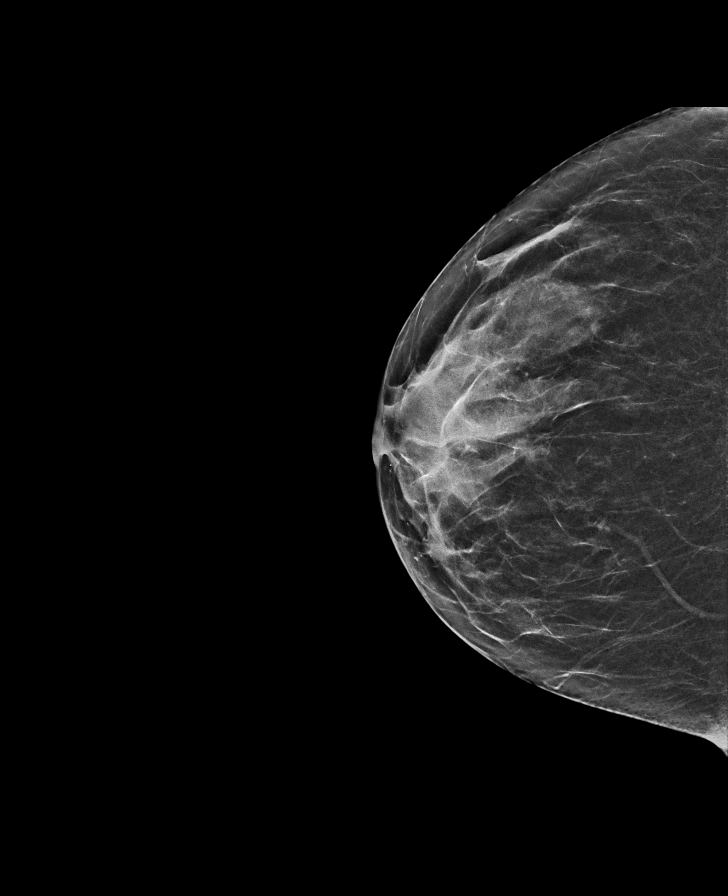

[L MLO tomo · tomo slice 32/63.0]
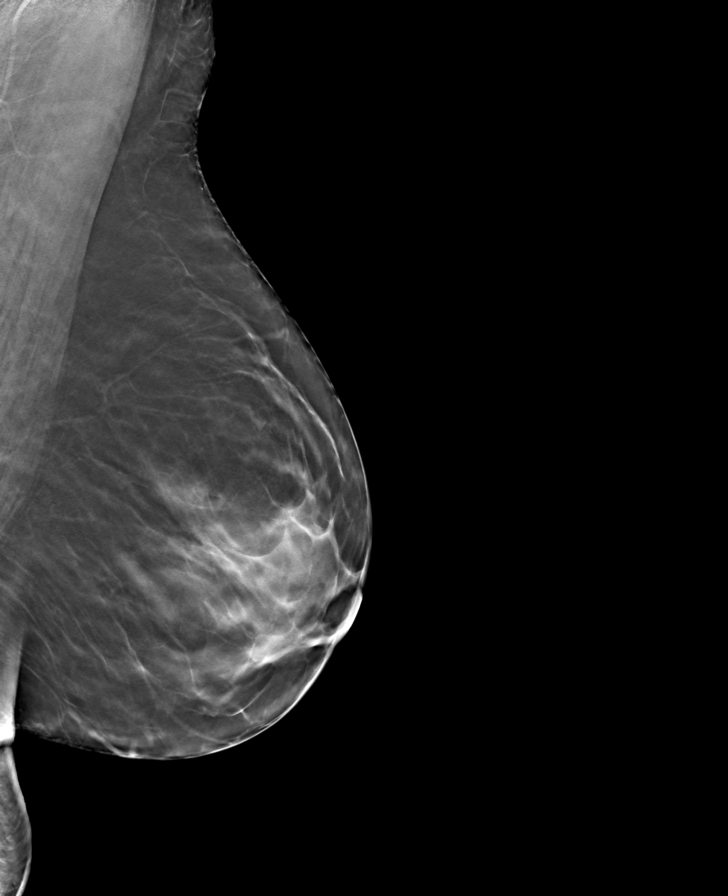

[R CC tomo · tomo slice 29/57.0]
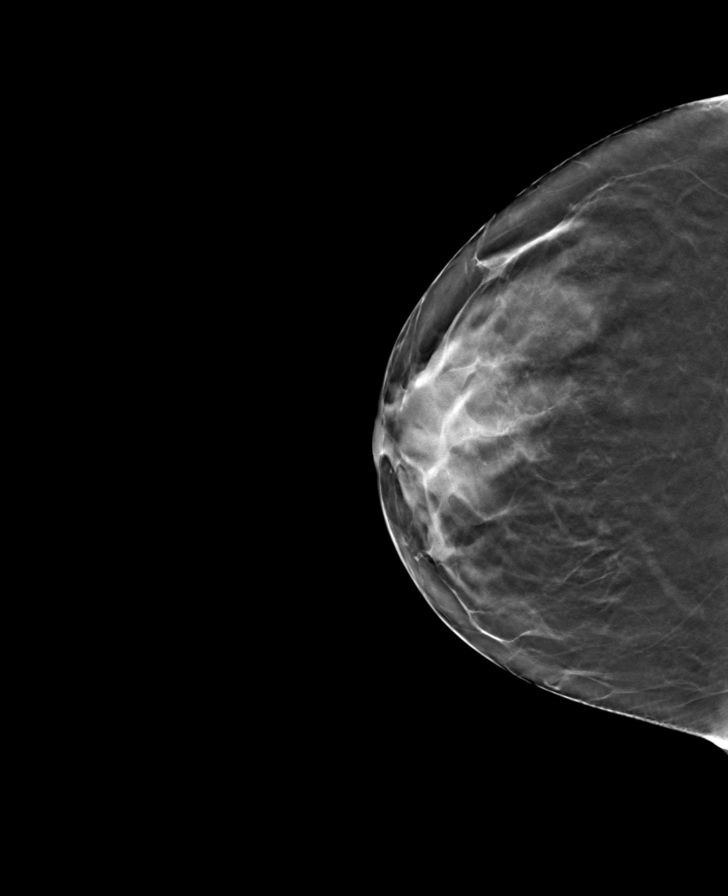

[R MLO tomo · tomo slice 33/65.0]
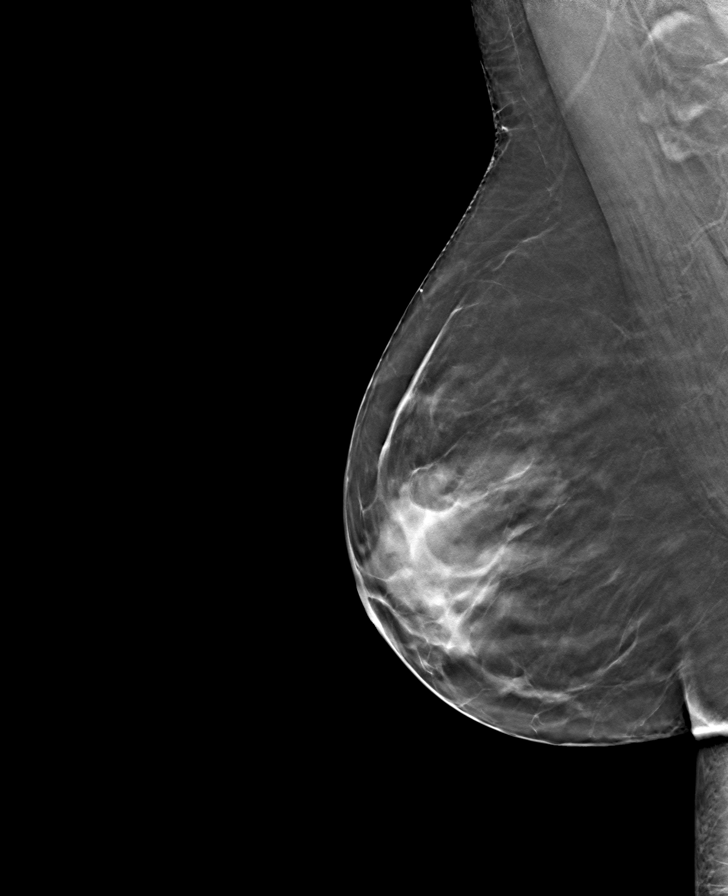

[L CC tomo · tomo slice 29/57.0]
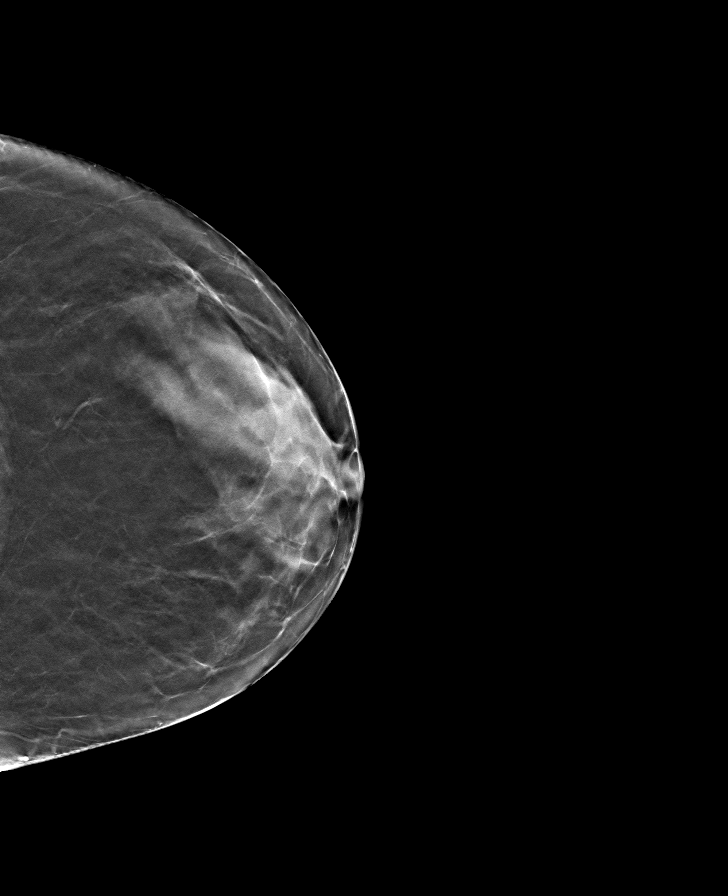

[8 of 24 positions shown; findings below may reference images not displayed]

ACR Breast Density Category c: The breast tissue is heterogeneously
dense, which may obscure small masses.
FINDINGS: There are no findings suspicious for malignancy. Images were
processed with CAD.
IMPRESSION: No mammographic evidence of malignancy. A result letter of this
screening mammogram will be mailed directly to the patient.

RECOMMENDATION:
Screening mammogram in one year. (Code:[id number])

BI-RADS CATEGORY  1: Negative.

## 2020-03-26 ENCOUNTER — Encounter: Payer: BC Managed Care – PPO | Admitting: Obstetrics and Gynecology

## 2020-04-01 ENCOUNTER — Encounter: Payer: BC Managed Care – PPO | Admitting: Obstetrics and Gynecology

## 2020-04-09 ENCOUNTER — Encounter: Payer: Self-pay | Admitting: Obstetrics and Gynecology

## 2020-06-10 ENCOUNTER — Telehealth: Payer: Self-pay

## 2020-06-10 NOTE — Telephone Encounter (Signed)
mychart sent.

## 2021-03-18 ENCOUNTER — Encounter: Payer: Self-pay | Admitting: Gastroenterology

## 2021-11-25 ENCOUNTER — Encounter: Payer: Self-pay | Admitting: Obstetrics and Gynecology
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
## 2022-01-13 ENCOUNTER — Other Ambulatory Visit (HOSPITAL_COMMUNITY)
Admission: RE | Admit: 2022-01-13 | Discharge: 2022-01-13 | Disposition: A | Discharge status: Home / Self Care | Payer: BC Managed Care – PPO | Source: Ambulatory Visit | Attending: Obstetrics and Gynecology | Admitting: Obstetrics and Gynecology

## 2022-01-13 ENCOUNTER — Encounter: Payer: Self-pay | Admitting: Obstetrics and Gynecology

## 2022-01-13 ENCOUNTER — Ambulatory Visit (INDEPENDENT_AMBULATORY_CARE_PROVIDER_SITE_OTHER): Payer: PRIVATE HEALTH INSURANCE | Admitting: Obstetrics and Gynecology

## 2022-01-13 VITALS — BP 145/73 | HR 63 | Resp 16 | Ht 63.0 in | Wt 170.6 lb

## 2022-01-13 DIAGNOSIS — Z1231 Encounter for screening mammogram for malignant neoplasm of breast: Secondary | ICD-10-CM | POA: Diagnosis not present

## 2022-01-13 DIAGNOSIS — N952 Postmenopausal atrophic vaginitis: Secondary | ICD-10-CM

## 2022-01-13 DIAGNOSIS — R350 Frequency of micturition: Secondary | ICD-10-CM | POA: Diagnosis not present

## 2022-01-13 DIAGNOSIS — Z131 Encounter for screening for diabetes mellitus: Secondary | ICD-10-CM

## 2022-01-13 DIAGNOSIS — Z124 Encounter for screening for malignant neoplasm of cervix: Secondary | ICD-10-CM

## 2022-01-13 DIAGNOSIS — Z01419 Encounter for gynecological examination (general) (routine) without abnormal findings: Secondary | ICD-10-CM | POA: Insufficient documentation

## 2022-01-13 DIAGNOSIS — Z13220 Encounter for screening for lipoid disorders: Secondary | ICD-10-CM

## 2022-01-13 DIAGNOSIS — R5383 Other fatigue: Secondary | ICD-10-CM

## 2022-01-13 DIAGNOSIS — Z1211 Encounter for screening for malignant neoplasm of colon: Secondary | ICD-10-CM | POA: Diagnosis not present

## 2022-01-13 DIAGNOSIS — R635 Abnormal weight gain: Secondary | ICD-10-CM

## 2022-01-13 LAB — POCT URINALYSIS DIPSTICK
Bilirubin, UA: NEGATIVE
Blood, UA: NEGATIVE
Glucose, UA: NEGATIVE
Ketones, UA: NEGATIVE
Leukocytes, UA: NEGATIVE
Nitrite, UA: NEGATIVE
Protein, UA: NEGATIVE
Spec Grav, UA: 1.01 (ref 1.010–1.025)
Urobilinogen, UA: 0.2 E.U./dL
pH, UA: 7 (ref 5.0–8.0)

## 2022-01-14 LAB — LIPID PANEL
Chol/HDL Ratio: 4.2 ratio (ref 0.0–4.4)
Cholesterol, Total: 200 mg/dL — ABNORMAL HIGH (ref 100–199)
HDL: 48 mg/dL (ref >39)
LDL Chol Calc (NIH): 120 mg/dL — ABNORMAL HIGH (ref 0–99)
Triglycerides: 182 mg/dL — ABNORMAL HIGH (ref 0–149)
VLDL Cholesterol Cal: 32 mg/dL (ref 5–40)

## 2022-01-14 LAB — HEMOGLOBIN A1C
Est. average glucose Bld gHb Est-mCnc: 103 mg/dL
Hgb A1c MFr Bld: 5.2 % (ref 4.8–5.6)

## 2022-01-14 LAB — CBC
Hematocrit: 43.8 % (ref 34.0–46.6)
Hemoglobin: 15.2 g/dL (ref 11.1–15.9)
MCH: 31.8 pg (ref 26.6–33.0)
MCHC: 34.7 g/dL (ref 31.5–35.7)
MCV: 92 fL (ref 79–97)
Platelets: 285 10*3/uL (ref 150–450)
RBC: 4.78 x10E6/uL (ref 3.77–5.28)
RDW: 13.3 % (ref 11.7–15.4)
WBC: 7.6 10*3/uL (ref 3.4–10.8)

## 2022-01-14 LAB — VITAMIN D 25 HYDROXY (VIT D DEFICIENCY, FRACTURES): Vit D, 25-Hydroxy: 42.7 ng/mL (ref 30.0–100.0)

## 2022-01-14 LAB — COMPREHENSIVE METABOLIC PANEL
ALT: 21 IU/L (ref 0–32)
AST: 23 IU/L (ref 0–40)
Albumin/Globulin Ratio: 2.1 (ref 1.2–2.2)
Albumin: 4.4 g/dL (ref 3.9–4.9)
Alkaline Phosphatase: 106 IU/L (ref 44–121)
BUN/Creatinine Ratio: 18 (ref 12–28)
BUN: 16 mg/dL (ref 8–27)
Bilirubin Total: 0.4 mg/dL (ref 0.0–1.2)
CO2: 25 mmol/L (ref 20–29)
Calcium: 9.7 mg/dL (ref 8.7–10.3)
Chloride: 104 mmol/L (ref 96–106)
Creatinine, Ser: 0.9 mg/dL (ref 0.57–1.00)
Globulin, Total: 2.1 g/dL (ref 1.5–4.5)
Glucose: 85 mg/dL (ref 70–99)
Potassium: 4.5 mmol/L (ref 3.5–5.2)
Sodium: 144 mmol/L (ref 134–144)
Total Protein: 6.5 g/dL (ref 6.0–8.5)
eGFR: 73 mL/min/{1.73_m2} (ref >59)

## 2022-01-14 LAB — TSH: TSH: 3.04 u[IU]/mL (ref 0.450–4.500)

## 2022-01-14 LAB — VITAMIN B12: Vitamin B-12: 790 pg/mL (ref 232–1245)

## 2022-01-18 ENCOUNTER — Telehealth: Payer: Self-pay | Admitting: Gastroenterology

## 2022-01-18 LAB — CYTOLOGY - PAP
Comment: NEGATIVE
Diagnosis: NEGATIVE
High risk HPV: NEGATIVE

## 2022-01-18 NOTE — Telephone Encounter (Signed)
Patient left vm stating we had a referral for her but she is seeing another gastro doctor.

## 2022-02-03 ENCOUNTER — Ambulatory Visit
Admission: RE | Admit: 2022-02-03 | Discharge: 2022-02-03 | Disposition: A | Discharge status: Home / Self Care | Payer: PRIVATE HEALTH INSURANCE | Source: Ambulatory Visit | Attending: Obstetrics and Gynecology | Admitting: Obstetrics and Gynecology

## 2022-02-03 DIAGNOSIS — Z1231 Encounter for screening mammogram for malignant neoplasm of breast: Secondary | ICD-10-CM | POA: Diagnosis present

## 2023-07-03 ENCOUNTER — Other Ambulatory Visit: Payer: Self-pay | Admitting: Neurological Surgery

## 2023-07-03 DIAGNOSIS — M48062 Spinal stenosis, lumbar region with neurogenic claudication: Secondary | ICD-10-CM

## 2023-07-10 ENCOUNTER — Ambulatory Visit
Admission: RE | Admit: 2023-07-10 | Discharge: 2023-07-10 | Disposition: A | Discharge status: Home / Self Care | Payer: PRIVATE HEALTH INSURANCE | Source: Ambulatory Visit | Attending: Neurological Surgery | Admitting: Neurological Surgery

## 2023-07-10 DIAGNOSIS — M48062 Spinal stenosis, lumbar region with neurogenic claudication: Secondary | ICD-10-CM | POA: Insufficient documentation
